# Patient Record
Sex: Male | Born: 1985 | Race: Black or African American | Hispanic: No | Marital: Single | Smoking: Never smoker
Health system: Southern US, Community
[De-identification: ages and names within clinical notes are randomized; demographics above are authoritative.]

---
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
## 2007-03-20 ENCOUNTER — Inpatient Hospital Stay: Payer: Self-pay | Admitting: Unknown Physician Specialty

## 2007-03-20 ENCOUNTER — Other Ambulatory Visit: Payer: Self-pay

## 2010-09-28 ENCOUNTER — Emergency Department: Payer: Self-pay | Admitting: Emergency Medicine

## 2012-06-13 IMAGING — CT CT HEAD WITHOUT CONTRAST
2 series · 16 of 30 positions shown, 20 images · non-contrast
Comparison: none

REASON FOR EXAM: FELL OFF PORCH
COMMENTS:

PROCEDURE:     CT  - CT HEAD WITHOUT CONTRAST  - September 28, 2010  [DATE]
RESULT:     Technique: Helical 5mm sections were obtained from the skull
base to the vertex without administration of intravenous contrast.

[Series 3: without · axial · non-contrast · 0.45mm/px · z∈[+390,+524]mm · 13 of 33 slices shown, 17 images]
[im 3/33  brain]
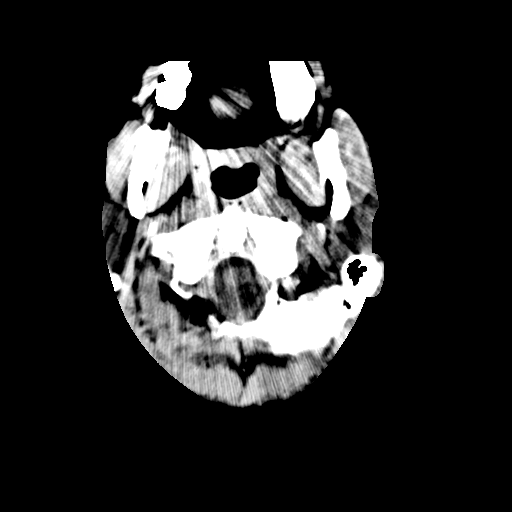
[im 3/33  bone]
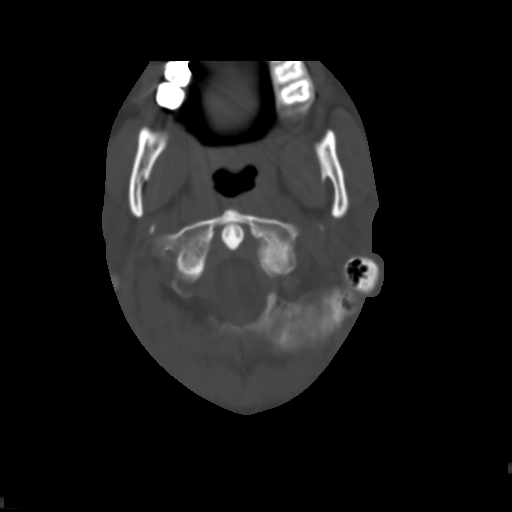
[im 5/33  brain]
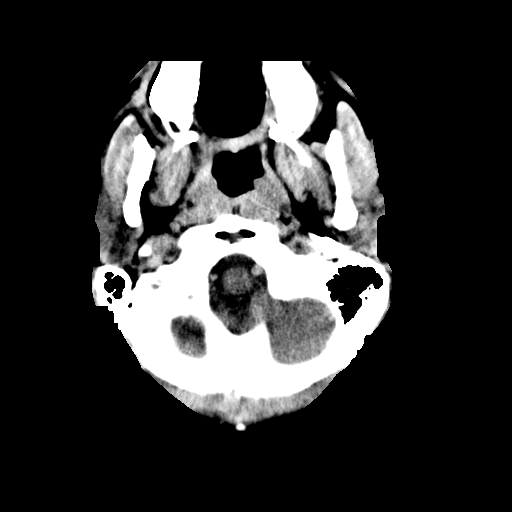
[im 7/33  brain]
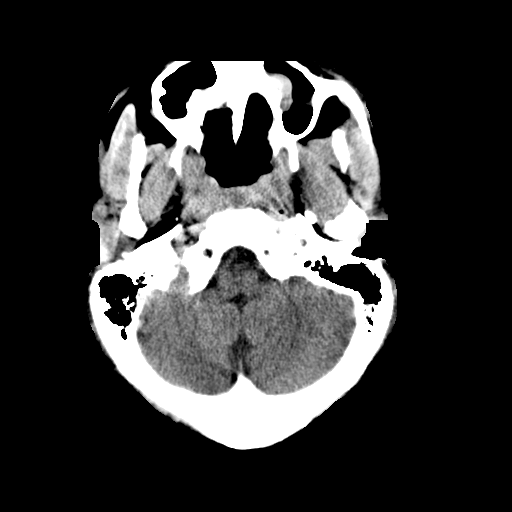
[im 10/33  brain]
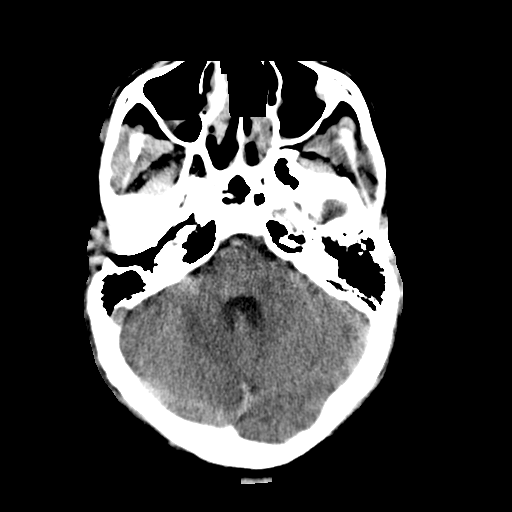
[im 12/33  brain]
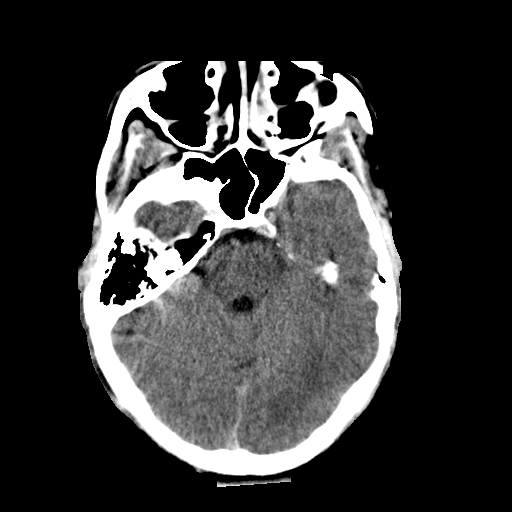
[im 12/33  bone]
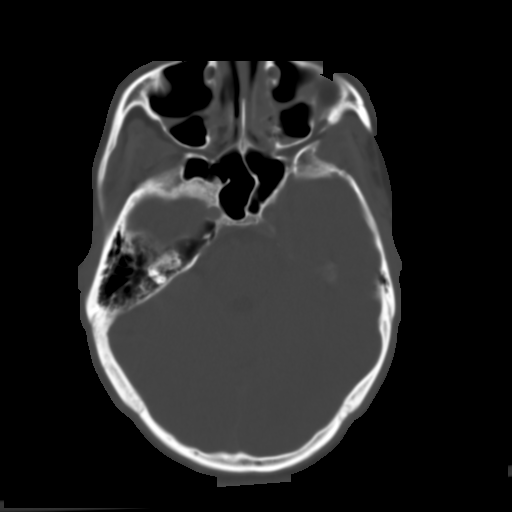
[im 14/33  brain]
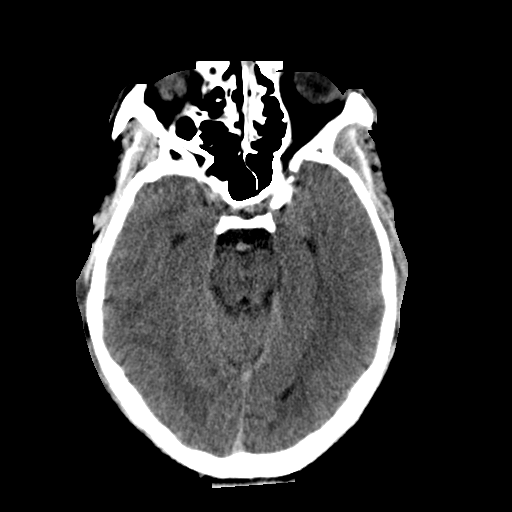
[im 17/33  brain]
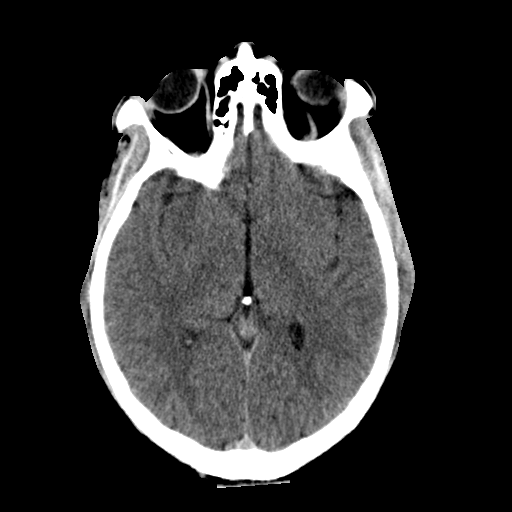
[im 19/33  brain]
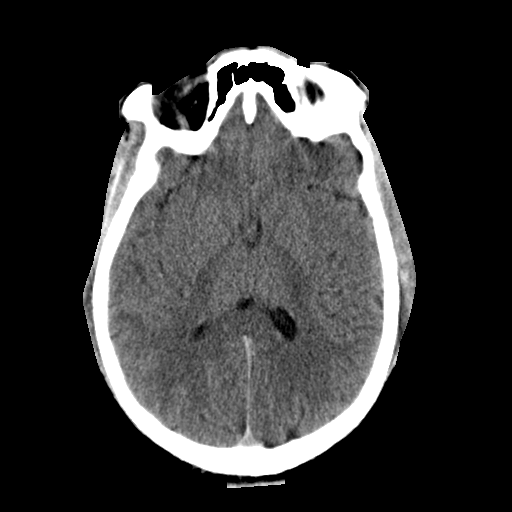
[im 21/33  brain]
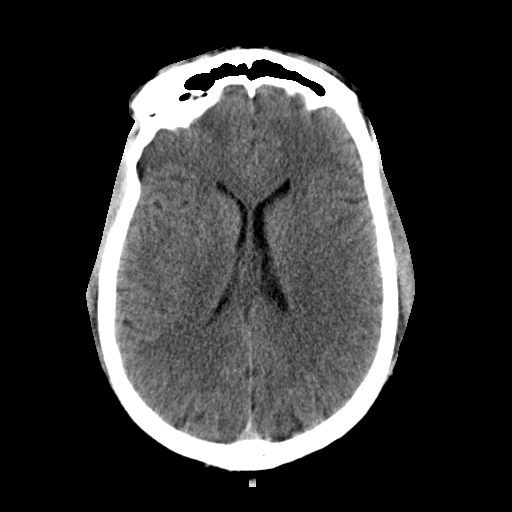
[im 21/33  bone]
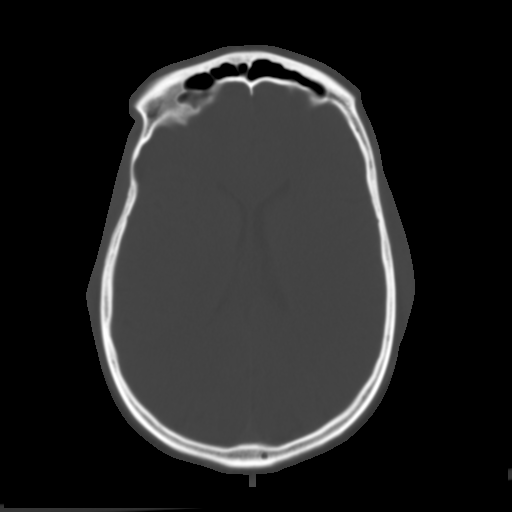
[im 23/33  brain]
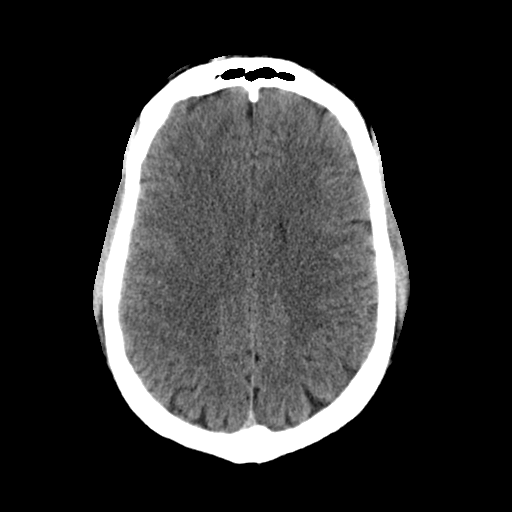
[im 26/33  brain]
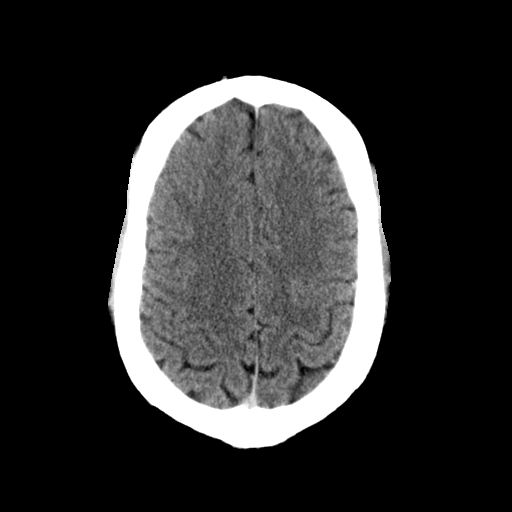
[im 28/33  brain]
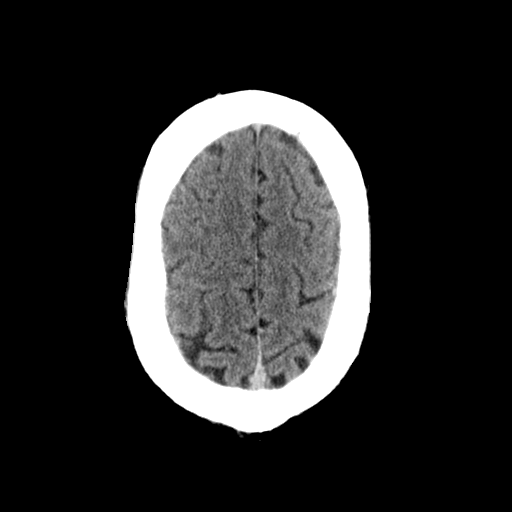
[im 30/33  brain]
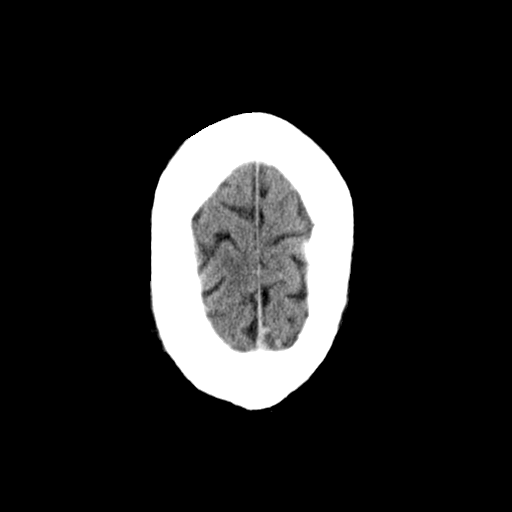
[im 30/33  bone]
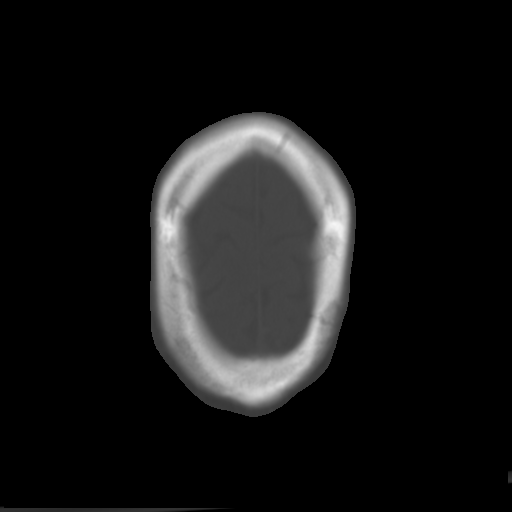

[Series 4: bone · axial · 0.45mm/px · z∈[+390,+434]mm · 3 of 33 slices shown]
[im 3/33  bone]
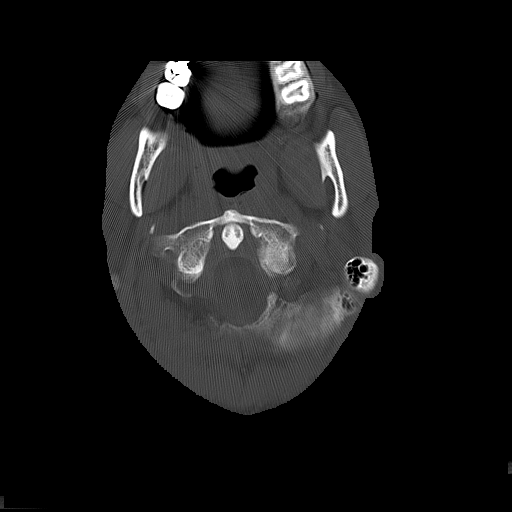
[im 7/33  bone]
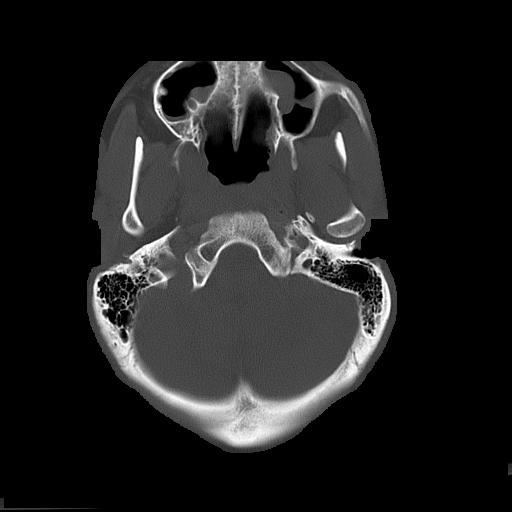
[im 12/33  bone]
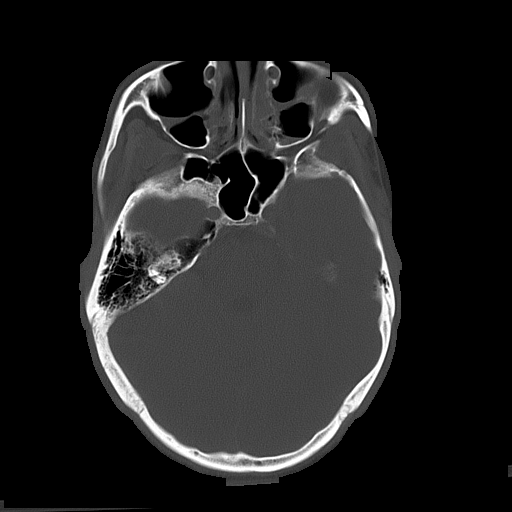

[16 of 30 positions shown; findings below may reference images not displayed]

FINDINGS: There is not evidence of intra-axial fluid collections. There is
no evidence of acute hemorrhage or secondary signs reflecting mass effect or
subacute or chronic focal territorial infarction. The osseous structures
demonstrate no evidence of a depressed skull fracture. If there is
persistent concern clinical follow-up with MRI is recommended.
IMPRESSION: 1. No evidence of acute intracranial abnormalitites.

## 2015-01-17 ENCOUNTER — Encounter: Payer: Self-pay | Admitting: Intensive Care

## 2015-01-17 ENCOUNTER — Emergency Department
Admission: EM | Admit: 2015-01-17 | Discharge: 2015-01-18 | Disposition: A | Discharge status: Home / Self Care | Payer: Self-pay | Attending: Emergency Medicine | Admitting: Emergency Medicine

## 2015-01-17 DIAGNOSIS — R45851 Suicidal ideations: Secondary | ICD-10-CM

## 2015-01-17 DIAGNOSIS — F329 Major depressive disorder, single episode, unspecified: Secondary | ICD-10-CM | POA: Insufficient documentation

## 2015-01-17 DIAGNOSIS — F32A Depression, unspecified: Secondary | ICD-10-CM

## 2015-01-17 LAB — URINE DRUG SCREEN, QUALITATIVE (ARMC ONLY)
AMPHETAMINES, UR SCREEN: NOT DETECTED
BENZODIAZEPINE, UR SCRN: NOT DETECTED
Barbiturates, Ur Screen: NOT DETECTED
CANNABINOID 50 NG, UR ~~LOC~~: NOT DETECTED
Cocaine Metabolite,Ur ~~LOC~~: NOT DETECTED
MDMA (ECSTASY) UR SCREEN: NOT DETECTED
METHADONE SCREEN, URINE: NOT DETECTED
Opiate, Ur Screen: NOT DETECTED
PHENCYCLIDINE (PCP) UR S: NOT DETECTED
TRICYCLIC, UR SCREEN: NOT DETECTED

## 2015-01-17 LAB — CBC
HEMATOCRIT: 50.9 % (ref 40.0–52.0)
Hemoglobin: 16.8 g/dL (ref 13.0–18.0)
MCH: 30.3 pg (ref 26.0–34.0)
MCHC: 33 g/dL (ref 32.0–36.0)
MCV: 91.6 fL (ref 80.0–100.0)
PLATELETS: 291 10*3/uL (ref 150–440)
RBC: 5.56 MIL/uL (ref 4.40–5.90)
RDW: 13.6 % (ref 11.5–14.5)
WBC: 3.9 10*3/uL (ref 3.8–10.6)

## 2015-01-17 LAB — COMPREHENSIVE METABOLIC PANEL
ALBUMIN: 4.7 g/dL (ref 3.5–5.0)
ALK PHOS: 48 U/L (ref 38–126)
ALT: 29 U/L (ref 17–63)
ANION GAP: 10 (ref 5–15)
AST: 23 U/L (ref 15–41)
BUN: 8 mg/dL (ref 6–20)
CALCIUM: 9.6 mg/dL (ref 8.9–10.3)
CHLORIDE: 104 mmol/L (ref 101–111)
CO2: 29 mmol/L (ref 22–32)
Creatinine, Ser: 1.15 mg/dL (ref 0.61–1.24)
GFR calc Af Amer: >60 mL/min (ref >60)
GLUCOSE: 110 mg/dL — AB (ref 65–99)
POTASSIUM: 3.7 mmol/L (ref 3.5–5.1)
SODIUM: 143 mmol/L (ref 135–145)
Total Bilirubin: 0.6 mg/dL (ref 0.3–1.2)
Total Protein: 8.7 g/dL — ABNORMAL HIGH (ref 6.5–8.1)

## 2015-01-17 LAB — ACETAMINOPHEN LEVEL: Acetaminophen (Tylenol), Serum: <10 ug/mL — ABNORMAL LOW (ref 10–30)

## 2015-01-17 LAB — SALICYLATE LEVEL

## 2015-01-17 LAB — ETHANOL: Alcohol, Ethyl (B): 227 mg/dL — ABNORMAL HIGH (ref <5)

## 2015-01-17 NOTE — ED Notes (Signed)
BEHAVIORAL HEALTH ROUNDING  Patient sleeping: No.  Patient alert and oriented: yes  Behavior appropriate: Yes. ; If no, describe:  Nutrition and fluids offered: Yes  Toileting and hygiene offered: Yes  Sitter present: not applicable  Law enforcement present: Yes ODS  

## 2015-01-17 NOTE — ED Notes (Signed)

## 2015-01-17 NOTE — ED Notes (Signed)

## 2015-01-17 NOTE — ED Notes (Signed)
BEHAVIORAL HEALTH ROUNDING Patient sleeping: Yes.   Patient alert and oriented: not applicable SLEEPING Behavior appropriate: Yes.  ; If no, describe: SLEEPING Nutrition and fluids offered: No SLEEPING Toileting and hygiene offered: NoSLEEPING Sitter present: not applicable Law enforcement present: Yes ODS 

## 2015-01-17 NOTE — ED Notes (Signed)
Patient assigned to appropriate care area. Patient oriented to unit/care area: Informed that, for their safety, care areas are designed for safety and monitored by staff at all times; and visiting hours explained to patient. Patient verbalizes understanding, and verbal contract for safety obtained. 

## 2015-01-17 NOTE — ED Notes (Signed)
BEHAVIORAL HEALTH ROUNDING Patient sleeping: No. Patient alert and oriented: yes Behavior appropriate: Yes.  ; If no, describe:  Nutrition and fluids offered: Yes  Toileting and hygiene offered: Yes  Sitter present: not applicable Law enforcement present: Yes  

## 2015-01-17 NOTE — ED Provider Notes (Signed)
[facility] Emergency Department Provider Note  Time seen: 6:42 PM  I have reviewed the triage vital signs and the nursing notes.   HISTORY  Chief Complaint Suicidal    HPI Kerry Gray is a 29 y.o. male with no past medical history who presents the emergency department with SI. According to the patient he states he was intoxicated drinking beer and made a comment to his mother that he was going to kill himself. Mom called police who found him to the emergency department under an involuntary commitment. Patient denies any SI or HI. He states it was an "empty threat "and he had nointention to follow through. The patient does admit however that in 2010 he tried to hurt himself taking an overdose of pills. Denies any medical complaints at this time.    History reviewed. No pertinent past medical history.  There are no active problems to display for this patient.   History reviewed. No pertinent past surgical history.  No current outpatient prescriptions on file.  Allergies Review of patient's allergies indicates no known allergies.  History reviewed. No pertinent family history.  Social History History  Substance Use Topics  . Smoking status: Never Smoker   . Smokeless tobacco: Never Used  . Alcohol Use: Yes     Comment: 24-40 oz/day    Review of Systems Constitutional: Negative for fever. Cardiovascular: Negative for chest pain. Respiratory: Negative for shortness of breath. Gastrointestinal: Negative for abdominal pain Genitourinary: Negative for dysuria. Musculoskeletal: Negative for back pain. Neurological: Negative for headache 10-point ROS otherwise negative.  ____________________________________________   PHYSICAL EXAM:  VITAL SIGNS: ED Triage Vitals  Enc Vitals Group     BP 01/17/15 1711 133/85 mmHg     Pulse Rate 01/17/15 1711 102     Resp 01/17/15 1711 20     Temp 01/17/15 1711 97.7 F (36.5 C)     Temp Source 01/17/15 1711  Oral     SpO2 01/17/15 1711 98 %     Weight 01/17/15 1711 160 lb (72.576 kg)     Height 01/17/15 1711  (1.88 m)     Head Cir --      Peak Flow --      Pain Score --      Pain Loc --      Pain Edu? --      Excl. in GC? --     Constitutional: Alert and oriented. Well appearing and in no distress. Eyes: Normal exam ENT   Head: Normocephalic and atraumatic.   Mouth/Throat: Mucous membranes are moist. Cardiovascular: Normal rate, regular rhythm. Respiratory: Normal respiratory effort without tachypnea nor retractions. Breath sounds are clear Gastrointestinal: Soft and nontender. No distention.  Musculoskeletal: Nontender with normal range of motion in all extremities.  Neurologic:  Normal speech and language. No gross focal neurologic deficits  Skin:  Skin is warm, dry and intact.  Psychiatric: Mood and affect are normal. Speech and behavior are normal.   ____________________________________________   INITIAL IMPRESSION / ASSESSMENT AND PLAN / ED COURSE  Pertinent labs & imaging results that were available during my care of the patient were reviewed by me and considered in my medical decision making (see chart for details).  Patient with SI under IVC who now denies SI. We will check labs. However due to his history of suicide attempt in the past and I will keep the patient under an involuntary commitment until he can be properly evaluated by psychiatry.  ____________________________________________  FINAL CLINICAL IMPRESSION(S) / ED DIAGNOSES  Depression Suicidal ideation   Minna AntisKevin Giulliana Mcroberts, MD 01/17/15 2322

## 2015-01-17 NOTE — ED Notes (Signed)
Report received from [employer] RN. Patient care assumed. Patient/RN introduction complete. Will continue to monitor.

## 2015-01-17 NOTE — ED Notes (Signed)
Pt. transfered to BHU without incident after report from. Placed in room and oriented to unit. Pt. informed that for their safety all care areas are designed for safety and monitored by security cameras at all times; and visiting hours explained to patient. Patient verbalizes understanding, and verbal contract for safety obtained.   

## 2015-01-17 NOTE — ED Notes (Signed)
APPEARANCE/BEHAVIOR  calm, cooperative and adequate rapport can be established  NEURO ASSESSMENT  Orientation: time, place and person  Hallucinations: No.None noted (Hallucinations)  Speech: Normal  Gait: normal  RESPIRATORY ASSESSMENT  WNL  CARDIOVASCULAR ASSESSMENT  WNL  GASTROINTESTINAL ASSESSMENT  WNL  EXTREMITIES  ROM of all joints is normal  PLAN OF CARE  Provide calm/safe environment. Vital signs assessed twice daily. ED BHU Assessment once each 12-hour shift. Collaborate with intake RN daily or as condition indicates. Assure the ED provider has rounded once each shift. Provide and encourage hygiene. Provide redirection as needed. Assess for escalating behavior; address immediately and inform ED provider.  Assess family dynamic and appropriateness for visitation as needed: Yes. ; If necessary, describe findings:  Educate the patient/family about BHU procedures/visitation: Yes. ; If necessary, describe findings:   

## 2015-01-17 NOTE — ED Notes (Signed)
ED BHU PLACEMENT JUSTIFICATION Is the patient under IVC or is there intent for IVC: Yes.   Is the patient medically cleared: Yes.   Is there vacancy in the ED BHU: Yes.   Is the population mix appropriate for patient: Yes.   Is the patient awaiting placement in inpatient or outpatient setting: Yes.   Has the patient had a psychiatric consult: No. Survey of unit performed for contraband, proper placement and condition of furniture, tampering with fixtures in bathroom, shower, and each patient room: Yes.  ; Findings:  APPEARANCE/BEHAVIOR calm, cooperative and adequate rapport can be established NEURO ASSESSMENT Orientation: time, place and person Hallucinations: No.None noted (Hallucinations) Speech: Normal Gait: normal RESPIRATORY ASSESSMENT Normal expansion.  Clear to auscultation.  No rales, rhonchi, or wheezing. CARDIOVASCULAR ASSESSMENT regular rate and rhythm, S1, S2 normal, no murmur, click, rub or gallop GASTROINTESTINAL ASSESSMENT soft, nontender, BS WNL, no r/g EXTREMITIES normal strength, tone, and muscle mass PLAN OF CARE Provide calm/safe environment. Vital signs assessed twice daily. ED BHU Assessment once each 12-hour shift. Collaborate with intake RN daily or as condition indicates. Assure the ED provider has rounded once each shift. Provide and encourage hygiene. Provide redirection as needed. Assess for escalating behavior; address immediately and inform ED provider.  Assess family dynamic and appropriateness for visitation as needed: Yes.  ; If necessary, describe findings:  Educate the patient/family about BHU procedures/visitation: Yes.  ; If necessary, describe findings:  

## 2015-01-17 NOTE — ED Notes (Signed)
[facility]. Pt brought in by [employer] under IVC. IVC papers reports pt stated he was going to kill himself with pipes in his house. RN assessed pt, pt denies SI/HI. Pt states "he does not want to hurt himself, they were empty threats". Pt cooperative

## 2015-01-17 NOTE — ED Notes (Signed)
Pt with no complaints at this time calm and cooperative.  Will continue to monitor.

## 2015-01-18 NOTE — ED Notes (Signed)
Pt changed out and dc'd to lobby; plan of care shared with mother

## 2015-01-18 NOTE — ED Notes (Signed)
No change in condition will continue to monitor  

## 2015-01-18 NOTE — Consult Note (Signed)
[facility] Consult   Reason for Consult:  Follow up Referring Physician:  ER Patient Identification: Kerry Gray MRN:  [id number] Principal Diagnosis: <principal problem not specified> Diagnosis:  There are no active problems to display for this patient.   Total Time spent with patient: 45 minutes  Subjective:   Kerry Gray is a 29 y.o. male patient admitted with "alcohol drinkimg.".  HPI:  Drinking at the rate of "s much as I can" Had a DWI. CC" Came here for help." HPI Elements:     Past Medical History: History reviewed. No pertinent past medical history. History reviewed. No pertinent past surgical history. Family History: History reviewed. No pertinent family history. Social History:  History  Alcohol Use  . Yes    Comment: 24-40 oz/day     History  Drug Use No    History   Social History  . Marital Status: Single    Spouse Name: N/A  . Number of Children: N/A  . Years of Education: N/A   Social History Main Topics  . Smoking status: Never Smoker   . Smokeless tobacco: Never Used  . Alcohol Use: Yes     Comment: 24-40 oz/day  . Drug Use: No  . Sexual Activity: Not on file   Other Topics Concern  . None   Social History Narrative  . None   Additional Social History:                          Allergies:  No Known Allergies  Labs:  Results for orders placed or performed during the hospital encounter of 01/17/15 (from the past 48 hour(s))  Acetaminophen level     Status: Abnormal   Collection Time: 01/17/15  5:20 PM  Result Value Ref Range   Acetaminophen (Tylenol), Serum <10 (L) 10 - 30 ug/mL    Comment:        THERAPEUTIC CONCENTRATIONS VARY SIGNIFICANTLY. A RANGE OF 10-30 ug/mL MAY BE AN EFFECTIVE CONCENTRATION FOR MANY PATIENTS. HOWEVER, SOME ARE BEST TREATED AT CONCENTRATIONS OUTSIDE THIS RANGE. ACETAMINOPHEN CONCENTRATIONS >150 ug/mL AT 4 HOURS AFTER INGESTION AND >50 ug/mL AT 12 HOURS AFTER INGESTION ARE OFTEN  ASSOCIATED WITH TOXIC REACTIONS.   CBC     Status: None   Collection Time: 01/17/15  5:20 PM  Result Value Ref Range   WBC 3.9 3.8 - 10.6 K/uL   RBC 5.56 4.40 - 5.90 MIL/uL   Hemoglobin 16.8 13.0 - 18.0 g/dL   HCT 50.9 40.0 - 52.0 %   MCV 91.6 80.0 - 100.0 fL   MCH 30.3 26.0 - 34.0 pg   MCHC 33.0 32.0 - 36.0 g/dL   RDW 13.6 11.5 - 14.5 %   Platelets 291 150 - 440 K/uL  Comprehensive metabolic panel     Status: Abnormal   Collection Time: 01/17/15  5:20 PM  Result Value Ref Range   Sodium 143 135 - 145 mmol/L   Potassium 3.7 3.5 - 5.1 mmol/L   Chloride 104 101 - 111 mmol/L   CO2 29 22 - 32 mmol/L   Glucose, Bld 110 (H) 65 - 99 mg/dL   BUN 8 6 - 20 mg/dL   Creatinine, Ser 1.15 0.61 - 1.24 mg/dL   Calcium 9.6 8.9 - 10.3 mg/dL   Total Protein 8.7 (H) 6.5 - 8.1 g/dL   Albumin 4.7 3.5 - 5.0 g/dL   AST 23 15 - 41 U/L   ALT 29 17 - 63  U/L   Alkaline Phosphatase 48 38 - 126 U/L   Total Bilirubin 0.6 0.3 - 1.2 mg/dL   GFR calc non Af Amer >60 >60 mL/min   GFR calc Af Amer >60 >60 mL/min    Comment: (NOTE) The eGFR has been calculated using the CKD EPI equation. This calculation has not been validated in all clinical situations. eGFR's persistently <60 mL/min signify possible Chronic Kidney Disease.    Anion gap 10 5 - 15  Ethanol (ETOH)     Status: Abnormal   Collection Time: 01/17/15  5:20 PM  Result Value Ref Range   Alcohol, Ethyl (B) 227 (H) <5 mg/dL    Comment:        LOWEST DETECTABLE LIMIT FOR SERUM ALCOHOL IS 11 mg/dL FOR MEDICAL PURPOSES ONLY   Salicylate level     Status: None   Collection Time: 01/17/15  5:20 PM  Result Value Ref Range   Salicylate Lvl <0.8 2.8 - 30.0 mg/dL  Urine Drug Screen, Qualitative [facility])     Status: None   Collection Time: 01/17/15  5:34 PM  Result Value Ref Range   Tricyclic, Ur Screen NONE DETECTED NONE DETECTED   Amphetamines, Ur Screen NONE DETECTED NONE DETECTED   MDMA (Ecstasy)Ur Screen NONE DETECTED NONE DETECTED   Cocaine  Metabolite,Ur [place] NONE DETECTED NONE DETECTED   Opiate, Ur Screen NONE DETECTED NONE DETECTED   Phencyclidine (PCP) Ur S NONE DETECTED NONE DETECTED   Cannabinoid 50 Ng, Ur [place] NONE DETECTED NONE DETECTED   Barbiturates, Ur Screen NONE DETECTED NONE DETECTED   Benzodiazepine, Ur Scrn NONE DETECTED NONE DETECTED   Methadone Scn, Ur NONE DETECTED NONE DETECTED    Comment: (NOTE) 657  Tricyclics, urine               Cutoff 1000 ng/mL 200  Amphetamines, urine             Cutoff 1000 ng/mL 300  MDMA (Ecstasy), urine           Cutoff 500 ng/mL 400  Cocaine Metabolite, urine       Cutoff 300 ng/mL 500  Opiate, urine                   Cutoff 300 ng/mL 600  Phencyclidine (PCP), urine      Cutoff 25 ng/mL 700  Cannabinoid, urine              Cutoff 50 ng/mL 800  Barbiturates, urine             Cutoff 200 ng/mL 900  Benzodiazepine, urine           Cutoff 200 ng/mL 1000 Methadone, urine                Cutoff 300 ng/mL 1100 1200 The urine drug screen provides only a preliminary, unconfirmed 1300 analytical test result and should not be used for non-medical 1400 purposes. Clinical consideration and professional judgment should 1500 be applied to any positive drug screen result due to possible 1600 interfering substances. A more specific alternate chemical method 1700 must be used in order to obtain a confirmed analytical result.  1800 Gas chromato graphy / mass spectrometry (GC/MS) is the preferred 1900 confirmatory method.     Vitals: Blood pressure 138/89, pulse 75, temperature 98.6 F (37 C), temperature source Oral, resp. rate 18, height _0  (1.88 m), weight 72.576 kg (160 lb), SpO2 100 %.  Risk to Self: Suicidal Ideation: Yes-Currently Present Suicidal  Intent: No ("It was just an empty threat.") Is patient at risk for suicide?: Yes Suicidal Plan?: No Access to Means: No What has been your use of drugs/alcohol within the last 12 months?: alcohol; "I had (2) 12 oz beers; BAC: 227 How  many times?: 0 Other Self Harm Risks: denies Triggers for Past Attempts: None known Intentional Self Injurious Behavior: None Risk to Others: Homicidal Ideation: No Thoughts of Harm to Others: No Current Homicidal Intent: No Current Homicidal Plan: No Access to Homicidal Means: No Identified Victim: none History of harm to others?: No Assessment of Violence: On admission Violent Behavior Description: none Does patient have access to weapons?: No Criminal Charges Pending?: Yes Describe Pending Criminal Charges: DUI Does patient have a court date: Yes Court Date: 02/12/15 Prior Inpatient Therapy: Prior Inpatient Therapy: Yes Prior Therapy Dates: '2008 Prior Therapy Facilty/Provider(s): [facility] Reason for Treatment: Depression Prior Outpatient Therapy: Prior Outpatient Therapy: No Does patient have an ACCT team?: No Does patient have Intensive In-House Services?  : No Does patient have [facility] services? : No Does patient have P4[facility] services?: No  No current facility-administered medications for this encounter.   No current outpatient prescriptions on file.    Musculoskeletal: Strength & Muscle Tone: within normal limits Gait & Station: normal Patient leans: N/A  Psychiatric Specialty Exam: Physical Exam  Review of Systems  Constitutional: Negative.   HENT: Negative.   Eyes: Negative.   Respiratory: Negative.   Cardiovascular: Negative.   Gastrointestinal: Negative.   Genitourinary: Negative.   Skin: Negative.   Neurological: Negative.   Endo/Heme/Allergies: Negative.   Psychiatric/Behavioral: Positive for substance abuse.    Blood pressure 138/89, pulse 75, temperature 98.6 F (37 C), temperature source Oral, resp. rate 18, height 6' 2" (1.88 m), weight 72.576 kg (160 lb), SpO2 100 %.Body mass index is 20.53 kg/(m^2).  General Appearance: Casual  Eye Contact::  Good  Speech:  Normal Rate  Volume:  Normal  Mood:  Anxious  Affect:  Appropriate  Thought Process:   Goal Directed  Orientation:  Full (Time, Place, and Person)  Thought Content:  WDL  Suicidal Thoughts:  No  Homicidal Thoughts:  No  Memory:  Immediate;   Good Recent;   Good Remote;   Good adequate  Judgement:  Intact  Insight:  Fair  Psychomotor Activity:  Normal  Concentration:  Good  Recall:  Good  Fund of Knowledge:Good  Language: Fair  Akathisia:  No  Handed:  Right  AIMS (if indicated):     Assets:  Communication Skills  ADL's:  Intact  Cognition: WNL  Sleep:      Medical Decision Making: Established Problem, Stable/Improving (1)  Treatment Plan Summary: Plan D/C IVC and discharge pt to appropriate program for substance abuse  Plan:  No evidence of imminent risk to self or others at present.   Disposition: as above  Dewain Penning 01/18/2015 12:53 PM

## 2015-01-18 NOTE — ED Notes (Signed)
BEHAVIORAL HEALTH ROUNDING Patient sleeping: No. Patient alert and oriented: yes Behavior appropriate: Yes.  ; If no, describe:  Nutrition and fluids offered: Yes  Toileting and hygiene offered: Yes  Sitter present: not applicable Law enforcement present: Yes  

## 2015-01-18 NOTE — ED Notes (Signed)

## 2015-01-18 NOTE — BH Assessment (Signed)
Assessment Note  Kerry Gray is an 29 y.o. male, presents to the ED via the police under IVC for c/o having suicidal thoughts with alcohol intoxication; BAC: 227. Per client, "I was at my mom's house; I was drinking; I had (2) 12 oz beers; I made an empty threat to harm myself around my mother; I was under the influence of alcohol; I'm ready to leave." T/C was placed to client's mother--Vanessa Jago--[phone] at 22:52, no answer.   Axis I: Alcohol Abuse and Major Depression, Recurrent severe Axis II: Deferred Axis III: History reviewed. No pertinent past medical history. Axis IV: other psychosocial or environmental problems and problems with primary support group Axis V: 11-20 some danger of hurting self or others possible OR occasionally fails to maintain minimal personal hygiene OR gross impairment in communication  Past Medical History: History reviewed. No pertinent past medical history.  History reviewed. No pertinent past surgical history.  Family History: History reviewed. No pertinent family history.  Social History:  reports that he has never smoked. He has never used smokeless tobacco. He reports that he drinks alcohol. He reports that he does not use illicit drugs.  Additional Social History:     CIWA: CIWA-Ar BP: 113/82 mmHg Pulse Rate: 86 COWS:    Allergies: No Known Allergies  Home Medications:  (Not in a hospital admission)  OB/GYN Status:  No LMP for male patient.  General Assessment Data Location of Assessment: [facility] ED TTS Assessment: In system Is this a Tele or Face-to-Face Assessment?: Face-to-Face Is this an Initial Assessment or a Re-assessment for this encounter?: Initial Assessment Marital status: Single Maiden name:  (none) Is patient pregnant?: No Pregnancy Status: No Living Arrangements: Parent Can pt return to current living arrangement?: Yes Admission Status: Involuntary Is patient capable of signing voluntary admission?: Yes Referral  Source: Other ([facility]) Insurance type: none  Medical Screening Exam ([facility] Walk-in ONLY) Medical Exam completed: Yes  Crisis Care Plan Living Arrangements: Parent Name of Psychiatrist: none Name of Therapist: none  Education Status Is patient currently in school?: No Current Grade: n/a Highest grade of school patient has completed: college--junior Name of school: n/a Contact person: mother: Erie Noe Wiler--[phone]  Risk to self with the past 6 months Suicidal Ideation: Yes-Currently Present Has patient been a risk to self within the past 6 months prior to admission? : No Suicidal Intent: No ("It was just an empty threat.") Has patient had any suicidal intent within the past 6 months prior to admission? : No Is patient at risk for suicide?: Yes Suicidal Plan?: No Has patient had any suicidal plan within the past 6 months prior to admission? : No Access to Means: No What has been your use of drugs/alcohol within the last 12 months?: alcohol; "I had (2) 12 oz beers; BAC: 227 Previous Attempts/Gestures: No How many times?: 0 Other Self Harm Risks: denies Triggers for Past Attempts: None known Intentional Self Injurious Behavior: None Family Suicide History: No Recent stressful life event(s): Conflict (Comment) Persecutory voices/beliefs?: No Depression: Yes Depression Symptoms: Feeling angry/irritable Substance abuse history and/or treatment for substance abuse?: Yes Suicide prevention information given to non-admitted patients: Yes  Risk to Others within the past 6 months Homicidal Ideation: No Does patient have any lifetime risk of violence toward others beyond the six months prior to admission? : No Thoughts of Harm to Others: No Current Homicidal Intent: No Current Homicidal Plan: No Access to Homicidal Means: No Identified Victim: none History of harm to others?: No Assessment of Violence:  On admission Violent Behavior Description: none Does patient have access to  weapons?: No Criminal Charges Pending?: Yes Describe Pending Criminal Charges: DUI Does patient have a court date: Yes Court Date: 02/12/15 Is patient on probation?: No  Psychosis Hallucinations: None noted Delusions: None noted  Mental Status Report Appearance/Hygiene: In scrubs, Unremarkable Eye Contact: Fair Motor Activity: Unremarkable Speech: Slow, Soft Level of Consciousness: Alert Mood: Sad Affect: Sad Anxiety Level: Minimal Thought Processes: Circumstantial Judgement: Partial Orientation: Person, Place Obsessive Compulsive Thoughts/Behaviors: None  Cognitive Functioning Concentration: Fair Memory: Recent Intact, Remote Intact IQ: Average Insight: Fair Impulse Control: Fair Appetite: Fair Weight Loss: 0 Weight Gain: 0 Sleep: No Change Total Hours of Sleep: 6 Vegetative Symptoms: None  ADLScreening [facility] Assessment Services) Patient's cognitive ability adequate to safely complete daily activities?: Yes Patient able to express need for assistance with ADLs?: Yes Independently performs ADLs?: Yes (appropriate for developmental age)  Prior Inpatient Therapy Prior Inpatient Therapy: Yes Prior Therapy Dates: '2008 Prior [facility]ilty[facility]Provider[facility] Reason for Treatment: Depression  Prior Outpatient Therapy Prior Outpatient Therapy: No Does patient have an ACCT team?: No Does patient have Intensive In-House Services?  : No Does patient have [facility] services? : No Does patient have P4CC services?: No  ADL Screening (condition at time of admission) Patient's cognitive ability adequate to safely complete daily activities?: Yes Patient able to express need for assistance with ADLs?: Yes Independently performs ADLs?: Yes (appropriate for developmental age)       Abuse/Neglect Assessment (Assessment to be complete while patient is alone) Physical Abuse: Denies Verbal Abuse: Denies Sexual Abuse: Denies Exploitation of patient/patient's resources:  Denies Self-Neglect: Denies Values / Beliefs Cultural Requests During Hospitalization: None Spiritual Requests During Hospitalization: None Consults Spiritual Care Consult Needed: No Social Work Consult Needed: No Merchant navy officerAdvance Directives (For Healthcare) Does patient have an advance directive?: No    Additional Information 1:1 In Past 12 Months?: No CIRT Risk: No Elopement Risk: No Does patient have medical clearance?: Yes  Child/Adolescent Assessment Running Away Risk: Denies Bed-Wetting: Denies Destruction of Property: Denies Cruelty to Animals: Denies Stealing: Denies Rebellious/Defies Authority: Denies Satanic Involvement: Denies Archivistire Setting: Denies Problems at [employer]: Denies Gang Involvement: Denies  Disposition:  Disposition Initial Assessment Completed for this Encounter: Yes Disposition of Patient: Referred to (Psych MD) Patient referred to: Other (Comment) (Psych MD to see)  On Site Evaluation by:   Reviewed with Physician:    Dwan BoltMargaret Stephaine Breshears 01/18/2015 3:32 AM

## 2015-01-18 NOTE — ED Notes (Signed)
Pt laying in bed.  

## 2015-01-18 NOTE — Discharge Instructions (Signed)
Please seek medical attention and help for any thoughts about wanting to harm herself, harm others, any concerning change in behavior, severe depression, inappropriate drug use or any other new or concerning symptoms.  Depression Depression refers to feeling sad, low, down in the dumps, blue, gloomy, or empty. In general, there are two kinds of depression: 1. Normal sadness or normal grief. This kind of depression is one that we all feel from time to time after upsetting life experiences, such as the loss of a job or the ending of a relationship. This kind of depression is considered normal, is short lived, and resolves within a few days to 2 weeks. Depression experienced after the loss of a loved one (bereavement) often lasts longer than 2 weeks but normally gets better with time. 2. Clinical depression. This kind of depression lasts longer than normal sadness or normal grief or interferes with your ability to function at home, at work, and in school. It also interferes with your personal relationships. It affects almost every aspect of your life. Clinical depression is an illness. Symptoms of depression can also be caused by conditions other than those mentioned above, such as:  Physical illness. Some physical illnesses, including underactive thyroid gland (hypothyroidism), severe anemia, specific types of cancer, diabetes, uncontrolled seizures, heart and lung problems, strokes, and chronic pain are commonly associated with symptoms of depression.  Side effects of some prescription medicine. In some people, certain types of medicine can cause symptoms of depression.  Substance abuse. Abuse of alcohol and illicit drugs can cause symptoms of depression. SYMPTOMS Symptoms of normal sadness and normal grief include the following:  Feeling sad or crying for short periods of time.  Not caring about anything (apathy).  Difficulty sleeping or sleeping too much.  No longer able to enjoy the things  you used to enjoy.  Desire to be by oneself all the time (social isolation).  Lack of energy or motivation.  Difficulty concentrating or remembering.  Change in appetite or weight.  Restlessness or agitation. Symptoms of clinical depression include the same symptoms of normal sadness or normal grief and also the following symptoms:  Feeling sad or crying all the time.  Feelings of guilt or worthlessness.  Feelings of hopelessness or helplessness.  Thoughts of suicide or the desire to harm yourself (suicidal ideation).  Loss of touch with reality (psychotic symptoms). Seeing or hearing things that are not real (hallucinations) or having false beliefs about your life or the people around you (delusions and paranoia). DIAGNOSIS  The diagnosis of clinical depression is usually based on how bad the symptoms are and how long they have lasted. Your health care provider will also ask you questions about your medical history and substance use to find out if physical illness, use of prescription medicine, or substance abuse is causing your depression. Your health care provider may also order blood tests. TREATMENT  Often, normal sadness and normal grief do not require treatment. However, sometimes antidepressant medicine is given for bereavement to ease the depressive symptoms until they resolve. The treatment for clinical depression depends on how bad the symptoms are but often includes antidepressant medicine, counseling with a mental health professional, or both. Your health care provider will help to determine what treatment is best for you. Depression caused by physical illness usually goes away with appropriate medical treatment of the illness. If prescription medicine is causing depression, talk with your health care provider about stopping the medicine, decreasing the dose, or changing to another medicine.  Depression caused by the abuse of alcohol or illicit drugs goes away when you stop  using these substances. Some adults need professional help in order to stop drinking or using drugs. SEEK IMMEDIATE MEDICAL CARE IF:  You have thoughts about hurting yourself or others.  You lose touch with reality (have psychotic symptoms).  You are taking medicine for depression and have a serious side effect. FOR MORE INFORMATION  National Alliance on Mental Illness: www.nami.[employer] of Mental Health: http://www.maynard.net/ Document Released: 07/30/2000 Document Revised: 12/17/2013 Document Reviewed: 11/01/2011 [employer] Patient Information 2015 [place], [place]. This information is not intended to replace advice given to you by your health care provider. Make sure you discuss any questions you have with your health care provider.

## 2015-01-18 NOTE — Progress Notes (Addendum)
Counselor spoke with patient's mother Erie Noe(Vanessa XBJYNiggs [phone]) who states that patient's mental health has been deteriorating for the last 6 weeks and has been under a lot of stress. Couple of weeks ago patient reported he was suicidal and felt like he couldn't do this anymore per mother. He also asked his mother to sign documents that she would take care of his child. Per mother, yesterday she came home and found he had been drinking. He said that he wanted to hang self off the rafters. Mother reports that she tried to talk to him but said that she was against him.  Mother identifies him to be a great dad. Last several months he lost his job and car. Mother states that he told her he just didn't feel like life was worth living. Mother reports patient has an alcohol problem and attempted suicide in 2008 attempted to commit suicide.   MD notified of collateral information by Clinical research associatewriter.

## 2015-01-18 NOTE — ED Notes (Signed)
RN with MD and 3 SW's on rounds. 

## 2015-01-18 NOTE — ED Notes (Signed)

## 2015-01-18 NOTE — ED Provider Notes (Signed)
-----------------------------------------   7:10 AM on 01/18/2015 -----------------------------------------   BP 113/82 mmHg  Pulse 86  Temp(Src) 98 F (36.7 C) (Oral)  Resp 18  Ht 6\' 2"  (1.88 m)  Wt 160 lb (72.576 kg)  BMI 20.53 kg/m2  SpO2 99%  The patient had no acute events since last update.  Calm and cooperative at this time.  Disposition is pending per Psychiatry/Behavioral Medicine team recommendations.     Sharman CheekPhillip Alaynna Kerwood, MD 01/18/15 [phone]

## 2015-01-18 NOTE — ED Provider Notes (Signed)
-----------------------------------------   4:00 PM on 01/18/2015 -----------------------------------------  Patient has been seen by psychiatry. They have rescinded the IVC. Patient will follow-up with [facility].  Phineas SemenGraydon Aili Casillas, MD 01/18/15 1600

## 2015-01-18 NOTE — [facility] Counselor (Signed)
LATE ENTRY------Per request of Psych MD, Dr. Guss Bundehalla, writer provided the pt. with information and instructions on how to access Out Pt. Mental Health Treatment ([facility])

## 2015-01-18 NOTE — ED Notes (Signed)
Pt resting in bed.

## 2015-01-18 NOTE — ED Notes (Signed)
BEHAVIORAL HEALTH ROUNDING Patient sleeping: No. Patient alert and oriented: yes Behavior appropriate: Yes.  ; If no, describe:  Nutrition and fluids offered: Yes  Toileting and hygiene offered: Yes  Sitter present: not applicable Law enforcement present: Yes ODS/shift 

## 2015-01-18 NOTE — ED Notes (Signed)
BEHAVIORAL HEALTH ROUNDING Patient sleeping: Yes.   Patient alert and oriented: not applicable Behavior appropriate: Yes.  ; If no, describe:  Nutrition and fluids offered: No Toileting and hygiene offered: Yes  Sitter present: not applicable Law enforcement present: Yes  

## 2015-03-03 ENCOUNTER — Encounter (HOSPITAL_COMMUNITY): Payer: Self-pay | Admitting: Emergency Medicine

## 2015-03-03 ENCOUNTER — Emergency Department (HOSPITAL_COMMUNITY)
Admission: EM | Admit: 2015-03-03 | Discharge: 2015-03-03 | Disposition: A | Discharge status: Home / Self Care | Payer: Self-pay | Attending: Emergency Medicine | Admitting: Emergency Medicine

## 2015-03-03 DIAGNOSIS — G510 Bell's palsy: Secondary | ICD-10-CM | POA: Insufficient documentation

## 2015-03-03 MED ORDER — ACYCLOVIR 200 MG PO CAPS
400.0000 mg | ORAL_CAPSULE | Freq: Once | ORAL | Status: AC
  Administered 2015-03-03: 400 mg via ORAL
  Filled 2015-03-03: qty 2

## 2015-03-03 MED ORDER — PREDNISONE 20 MG PO TABS
60.0000 mg | ORAL_TABLET | Freq: Once | ORAL | Status: AC
  Administered 2015-03-03: 60 mg via ORAL
  Filled 2015-03-03: qty 3

## 2015-03-03 MED ORDER — ACYCLOVIR 400 MG PO TABS: 400.0000 mg | ORAL_TABLET | Freq: Every day | ORAL | Status: DC

## 2015-03-03 MED ORDER — PREDNISONE 20 MG PO TABS: 60.0000 mg | ORAL_TABLET | Freq: Every day | ORAL | Status: DC

## 2015-03-03 NOTE — Discharge Instructions (Signed)
Please read and follow all provided instructions.  Your diagnoses today include:  1. Bell's palsy    Tests performed today include:  Vital signs. See below for your results today.   Medications prescribed:   Prednisone - steroid medicine   It is best to take this medication in the morning to prevent sleeping problems. If you are diabetic, monitor your blood sugar closely and stop taking Prednisone if blood sugar is over 300. Take with food to prevent stomach upset.    Acyclovir - medication for viral infection  Take any prescribed medications only as directed.  Home care instructions:  Follow any educational materials contained in this packet.  BE VERY CAREFUL not to take multiple medicines containing Tylenol (also called acetaminophen). Doing so can lead to an overdose which can damage your liver and cause liver failure and possibly death.   Follow-up instructions: Please follow-up with your primary care provider in the next 7 days for further evaluation of your symptoms.   Return instructions:   Please return to the Emergency Department if you experience worsening symptoms.  Patient counseled to return if they have weakness in their arms or legs, slurred speech, trouble walking or talking, confusion, trouble with their balance, or if they have any other concerns. Patient verbalizes understanding and agrees with plan.   Please return if you have any other emergent concerns.  Additional Information:  Your vital signs today were: BP 123/78 mmHg   Pulse 85   Temp(Src) 98 F (36.7 C)   Resp 16   Ht  (1.88 m)   Wt 160 lb (72.576 kg)   BMI 20.53 kg/m2   SpO2 100% If your blood pressure (BP) was elevated above 135/85 this visit, please have this repeated by your doctor within one month. --------------

## 2015-03-03 NOTE — ED Notes (Signed)
Pt reports right sided facial numbness x 2 days. Pt denies injury. Pt reports having a headache x 2 days. Pt with right sided facial weakness and weakness noted to right hand.

## 2015-03-03 NOTE — ED Provider Notes (Signed)
CSN: [id number]     Arrival date & time 03/03/15  0757 History   First MD Initiated Contact with Patient 03/03/15 0800     Chief Complaint  Patient presents with  . Numbness     (Consider location/radiation/quality/duration/timing/severity/associated sxs/prior Treatment) HPI Comments: Patient with EtOH use, has not had alcohol in 1 month -- presents with two-day history of left sided facial droop, numbness, dysgeusia. Reports unable to close R eye complete and has had to hold it closed himself. Also difficulty drinking fluids 2/2 lip weakness. Patient denies head injury. Patient denies any weakness, numbness, or tingling in his arms or legs. He has had mild headache. No fevers, neck pain or neck stiffness. No slurred speech. The onset of this condition was acute. The course is constant. Aggravating factors: none. Alleviating factors: none.   No h/o strokes, diabetes, high cholesterol, high blood pressure.   The history is provided by the patient.    History reviewed. No pertinent past medical history. History reviewed. No pertinent past surgical history. No family history on file. History  Substance Use Topics  . Smoking status: Never Smoker   . Smokeless tobacco: Never Used  . Alcohol Use: Yes     Comment: 24-40 oz/day    Review of Systems  Constitutional: Negative for fever.  HENT: Negative for congestion, dental problem, rhinorrhea and sinus pressure.   Eyes: Negative for photophobia, discharge, redness and visual disturbance.  Respiratory: Negative for shortness of breath.   Cardiovascular: Negative for chest pain.  Gastrointestinal: Negative for nausea and vomiting.  Musculoskeletal: Negative for gait problem, neck pain and neck stiffness.  Skin: Negative for rash.  Neurological: Positive for facial asymmetry, weakness (R facial), numbness (R face) and headaches. Negative for syncope, speech difficulty and light-headedness.  Psychiatric/Behavioral: Negative for confusion.       Allergies  Review of patient's allergies indicates no known allergies.  Home Medications   Prior to Admission medications   Not on File   BP 129/79 mmHg  Pulse 68  Temp(Src) 98 F (36.7 C)  Resp 16  Ht  (1.88 m)  Wt 160 lb (72.576 kg)  BMI 20.53 kg/m2  SpO2 100%   Physical Exam  Constitutional: He is oriented to person, place, and time. He appears well-developed and well-nourished.  HENT:  Head: Normocephalic and atraumatic.  Right Ear: Tympanic membrane, external ear and ear canal normal.  Left Ear: Tympanic membrane, external ear and ear canal normal.  Nose: Nose normal.  Mouth/Throat: Uvula is midline, oropharynx is clear and moist and mucous membranes are normal.  Eyes: Conjunctivae, EOM and lids are normal. Pupils are equal, round, and reactive to light.  Neck: Normal range of motion. Neck supple.  Cardiovascular: Normal rate and regular rhythm.   Pulmonary/Chest: Effort normal and breath sounds normal.  Abdominal: Soft. There is no tenderness.  Musculoskeletal: Normal range of motion.       Cervical back: He exhibits normal range of motion, no tenderness and no bony tenderness.  Neurological: He is alert and oriented to person, place, and time. He has normal strength and normal reflexes. A cranial nerve deficit is present. No sensory deficit. He exhibits normal muscle tone. He displays a negative Romberg sign. Coordination and gait normal. GCS eye subscore is 4. GCS verbal subscore is 5. GCS motor subscore is 6.  Patient ambulates normally. No weakness detected of flexors or extensors of upper or lower extremities.  Patient with right-sided cranial nerve deficit including weakness of right face.  No tongue weakness. Forehead is NOT spared.  Skin: Skin is warm and dry.  Psychiatric: He has a normal mood and affect.  Nursing note and vitals reviewed.   ED Course  Procedures (including critical care time) Labs Review Labs Reviewed - No data to  display  Imaging Review No results found.   EKG Interpretation None       8:30 AM Patient seen and examined. Work-up initiated. Medications ordered.   Vital signs reviewed and are as follows: BP 129/79 mmHg  Pulse 68  Temp(Src) 98 F (36.7 C)  Resp 16  Ht 6\' 2"  (1.88 m)  Wt 160 lb (72.576 kg)  BMI 20.53 kg/m2  SpO2 100%  Patient has clinical exam consistent with Bell's palsy. No Ramsay-Hunt syndrome noted.   I cannot detect any R upper extremity weakness as noted in nursing note and patient denies all right upper extremity symptoms.   MDM   Final diagnoses:  Bell's palsy   Patient with clinical exam consistent with Bell's palsy. Do not suspect central cause of his symptoms today. Patient does not have risk factors for CVA. He denies headache. Recently seen for alcohol use, however patient states that he has not had alcohol in over one month. No signs of head trauma to suspect fall.  PCP referral given. Tx started in ED. No h/o renal problems to preclude use of acyclovir.   No dangerous or life-threatening conditions suspected or identified by history, physical exam, and by work-up. No indications for hospitalization identified.      Renne CriglerJoshua Aleeyah Bensen, PA-C 03/03/15 [id number]  Mancel BaleElliott Wentz, MD 03/03/15 [phone]

## 2015-03-11 ENCOUNTER — Encounter (HOSPITAL_COMMUNITY): Payer: Self-pay | Admitting: Emergency Medicine

## 2015-03-11 ENCOUNTER — Emergency Department (INDEPENDENT_AMBULATORY_CARE_PROVIDER_SITE_OTHER)
Admission: EM | Admit: 2015-03-11 | Discharge: 2015-03-11 | Disposition: A | Discharge status: Home / Self Care | Payer: Self-pay | Source: Home / Self Care | Attending: Emergency Medicine | Admitting: Emergency Medicine

## 2015-03-11 DIAGNOSIS — G510 Bell's palsy: Secondary | ICD-10-CM

## 2015-03-11 NOTE — ED Notes (Signed)
Pt here for a f/u from his ED visit for Bell's Palsy.  Pt states he completed all medications prescribed and has seen no improvement in his symptoms, but has no further complaints.

## 2015-03-11 NOTE — ED Provider Notes (Signed)
CSN: [id number]     Arrival date & time 03/11/15  1301 History   First MD Initiated Contact with Patient 03/11/15 1317     Chief Complaint  Patient presents with  . bells palsy    (Consider location/radiation/quality/duration/timing/severity/associated sxs/prior Treatment) HPI  He is a 29 year old man here for recheck of Bell's palsy. He states his symptoms started on July 15 with right sided facial droop and inability to close his right eye. He was seen in the emergency room on the 18th and started on prednisone and acyclovir. He states he has completed these medications. His symptoms are the same. He is still unable to fully close his eye. He denies any trouble with redness or dryness of the right eye. No pain.  History reviewed. No pertinent past medical history. History reviewed. No pertinent past surgical history. History reviewed. No pertinent family history. History  Substance Use Topics  . Smoking status: Never Smoker   . Smokeless tobacco: Never Used  . Alcohol Use: Yes     Comment: 24-40 oz/day    Review of Systems As in history of present illness Allergies  Review of patient's allergies indicates no known allergies.  Home Medications   Prior to Admission medications   Medication Sig Start Date End Date Taking? Authorizing Provider  acyclovir (ZOVIRAX) 400 MG tablet Take 1 tablet (400 mg total) by mouth 5 (five) times daily. 03/03/15   Renne Crigler, PA-C  predniSONE (DELTASONE) 20 MG tablet Take 3 tablets (60 mg total) by mouth daily. 03/03/15   Renne Crigler, PA-C   BP 121/78 mmHg  Pulse 65  Temp(Src) 98.1 F (36.7 C) (Oral)  Resp 18  SpO2 99% Physical Exam  Constitutional: He is oriented to person, place, and time. He appears well-developed and well-nourished.  Eyes: Conjunctivae are normal. Pupils are equal, round, and reactive to light.  No redness of the right eye  Cardiovascular: Normal rate.   Pulmonary/Chest: Effort normal.  Neurological: He is alert and  oriented to person, place, and time.  Right facial droop that involves the forehead. Right eye does not fully close.    ED Course  Procedures (including critical care time) Labs Review Labs Reviewed - No data to display  Imaging Review No results found.   MDM   1. Bell's palsy    Reassurance provided. Discussed that this will take weeks to months to resolve. Handout given.    Charm Rings, MD 03/11/15 [phone]

## 2015-03-11 NOTE — Discharge Instructions (Signed)
Bell's Palsy °Bell's palsy is a condition in which the muscles on one side of the face cannot move (paralysis). This is because the nerves in the face are paralyzed. It is most often thought to be caused by a virus. The virus causes swelling of the nerve that controls movement on one side of the face. The nerve travels through a tight space surrounded by bone. When the nerve swells, it can be compressed by the bone. This results in damage to the protective covering around the nerve. This damage interferes with how the nerve communicates with the muscles of the face. As a result, it can cause weakness or paralysis of the facial muscles.  °Injury (trauma), tumor, and surgery may cause Bell's palsy, but most of the time the cause is unknown. It is a relatively common condition. It starts suddenly (abrupt onset) with the paralysis usually ending within 2 days. Bell's palsy is not dangerous. But because the eye does not close properly, you may need care to keep the eye from getting dry. This can include splinting (to keep the eye shut) or moistening with artificial tears. Bell's palsy very seldom occurs on both sides of the face at the same time. °SYMPTOMS  °· Eyebrow sagging. °· Drooping of the eyelid and corner of the mouth. °· Inability to close one eye. °· Loss of taste on the front of the tongue. °· Sensitivity to loud noises. °TREATMENT  °The treatment is usually non-surgical. If the patient is seen within the first 24 to 48 hours, a short course of steroids may be prescribed, in an attempt to shorten the length of the condition. Antiviral medicines may also be used with the steroids, but it is unclear if they are helpful.  °You will need to protect your eye, if you cannot close it. The cornea (clear covering over your eye) will become dry and can be damaged. Artificial tears can be used to keep your eye moist. Glasses or an eye patch should be worn to protect your eye. °PROGNOSIS  °Recovery is variable, ranging  from days to months. Although the problem usually goes away completely (about 80% of cases resolve), predicting the outcome is impossible. Most people improve within 3 weeks of when the symptoms began. Improvement may continue for 3 to 6 months. A small number of people have moderate to severe weakness that is permanent.  °HOME CARE INSTRUCTIONS  °· If your caregiver prescribed medication to reduce swelling in the nerve, use as directed. Do not stop taking the medication unless directed by your caregiver. °· Use moisturizing eye drops as needed to prevent drying of your eye, as directed by your caregiver. °· Protect your eye, as directed by your caregiver. °· Use facial massage and exercises, as directed by your caregiver. °· Perform your normal activities, and get your normal rest. °SEEK IMMEDIATE MEDICAL CARE IF:  °· There is pain, redness or irritation in the eye. °· You or your child has an oral temperature above 102° F (38.9° C), not controlled by medicine. °MAKE SURE YOU:  °· Understand these instructions. °· Will watch your condition. °· Will get help right away if you are not doing well or get worse. °Document Released: 08/02/2005 Document Revised: 10/25/2011 Document Reviewed: 11/09/2013 °ExitCare® Patient Information ©2015 [employer]. This information is not intended to replace advice given to you by your health care provider. Make sure you discuss any questions you have with your health care provider. ° °

## 2015-03-26 ENCOUNTER — Ambulatory Visit (INDEPENDENT_AMBULATORY_CARE_PROVIDER_SITE_OTHER): Payer: Self-pay | Admitting: Family Medicine

## 2015-03-26 VITALS — BP 117/72 | HR 79 | Temp 98.2°F | Resp 14 | Ht 74.0 in | Wt 170.0 lb

## 2015-03-26 DIAGNOSIS — Z7689 Persons encountering health services in other specified circumstances: Secondary | ICD-10-CM

## 2015-03-26 DIAGNOSIS — Z7189 Other specified counseling: Secondary | ICD-10-CM

## 2015-03-26 DIAGNOSIS — G510 Bell's palsy: Secondary | ICD-10-CM

## 2015-03-26 DIAGNOSIS — Z0000 Encounter for general adult medical examination without abnormal findings: Secondary | ICD-10-CM

## 2015-03-26 NOTE — Progress Notes (Signed)
Patient ID: Kerry Gray, male   DOB: Dec 23, 1985, 29 y.o.   MRN: [id number]   Kerry Gray, is a 29 y.o. male  WUJ:[id number]  [id number]  DOB - 07/03/1986  CC:  Chief Complaint  Patient presents with  . Establish Care       HPI: Kerry Gray is a 29 y.o. male here to establish care. He reports no chronic illnesses. He does have a recent history of Bells's Palsy and was treated with prednisone and acyclovir symptoms are resolving.He is on no chronic medications. No Known Allergies No past medical history on file. Current Outpatient Prescriptions on File Prior to Visit  Medication Sig Dispense Refill  . acyclovir (ZOVIRAX) 400 MG tablet Take 1 tablet (400 mg total) by mouth 5 (five) times daily. (Patient not taking: Reported on 03/26/2015) 35 tablet 0  . predniSONE (DELTASONE) 20 MG tablet Take 3 tablets (60 mg total) by mouth daily. (Patient not taking: Reported on 03/26/2015) 21 tablet 0   No current facility-administered medications on file prior to visit.   No family history on file. Social History   Social History  . Marital Status: Single    Spouse Name: N/A  . Number of Children: N/A  . Years of Education: N/A   Occupational History  . Not on file.   Social History Main Topics  . Smoking status: Never Smoker   . Smokeless tobacco: Never Used  . Alcohol Use: Yes     Comment: 24-40 oz/day  . Drug Use: No  . Sexual Activity: Not on file   Other Topics Concern  . Not on file   Social History Narrative    Review of Systems: Constitutional: Negative for fever, chills, appetite change, weight loss,  fatigue. HENT: Negative for ear pain, ear discharge.nose bleeds Eyes: Negative for pain, discharge, redness, itching and visual disturbance. Neck: Negative for pain, stiffness Respiratory: Negative for cough, shortness of breath,   Cardiovascular: Negative for chest pain, palpitations and leg swelling. Gastrointestinal: Negative for abdominal distention, abdominal  pain, nausea, vomiting, diarrhea, constipations Genitourinary: Negative for dysuria, urgency, frequency, hematuria, flank pain,  Musculoskeletal: Negative for back pain, joint pain, joint  swelling, arthralgia and gait problem.Negative for weakness. Neurological: Negative for dizziness, tremors, seizures, syncope,   light-headedness, numbness and headaches.  Hematological: Negative for easy bruising or bleeding Psychiatric/Behavioral: Negative for depression, anxiety, decreased concentration, confusion    Objective:   Filed Vitals:   03/26/15 1347  BP: 117/72  Pulse: 79  Temp: 98.2 F (36.8 C)  Resp: 14    Physical Exam: Constitutional: Patient appears well-developed and well-nourished. No distress. HENT: Normocephalic, atraumatic, External right and left ear normal. Oropharynx is clear and moist.  Eyes: Conjunctivae and EOM are normal. PERRLA, no scleral icterus. Neck: Normal ROM. Neck supple. No lymphadenopathy, No thyromegaly. CVS: RRR, S1/S2 +, no murmurs, no gallops, no rubs Pulmonary: Effort and breath sounds normal, no stridor, rhonchi, wheezes, rales.  Abdominal: Soft. Normoactive BS,, no distension, tenderness, rebound or guarding.  Musculoskeletal: Normal range of motion. No edema and no tenderness.  Neuro: Alert.Normal muscle tone coordination. Non-focal Skin: Skin is warm and dry. No rash noted. Not diaphoretic. No erythema. No pallor. Psychiatric: Normal mood and affect. Behavior, judgment, thought content normal.  Lab Results  Component Value Date   WBC 3.9 01/17/2015   HGB 16.8 01/17/2015   HCT 50.9 01/17/2015   MCV 91.6 01/17/2015   PLT 291 01/17/2015   Lab Results  Component Value Date   CREATININE 1.15 01/17/2015  BUN 8 01/17/2015   NA 143 01/17/2015   K 3.7 01/17/2015   CL 104 01/17/2015   CO2 29 01/17/2015    No results found for: HGBA1C Lipid Panel  No results found for: CHOL, TRIG, HDL, CHOLHDL, VLDL, LDLCALC     Assessment and plan:    Visit to establish care -He has recently bloodwork and none of thay calls for a recheck today -HIV as he has not had a screening  No Follow-up on file.  The patient was given clear instructions to go to ER or return to medical center if symptoms don't improve, worsen or new problems develop. The patient verbalized understanding. The patient was told to call to get lab results if they haven't heard anything in the next week.       Kerry Hoover, MSN, FNP-BC   03/26/2015, 1:52 PM

## 2015-03-26 NOTE — Patient Instructions (Signed)
Work on trying to live a healthy life style, including diet of lots of fruits and vegetables, low in fats and cholesterol and concentrated sweets. Drink plenty of water Use safety measures such as seat belts, helmets, make sure your fire extinguishers and smoke alarms in good contion Use protection to prevent sexually transmitted diseases Follow-up in one year for routine health care and as needed otherwise.

## 2015-03-27 ENCOUNTER — Encounter: Payer: Self-pay | Admitting: Family Medicine

## 2015-03-27 DIAGNOSIS — G510 Bell's palsy: Secondary | ICD-10-CM | POA: Insufficient documentation

## 2015-03-27 LAB — HIV ANTIBODY (ROUTINE TESTING W REFLEX): HIV 1&2 Ab, 4th Generation: NONREACTIVE

## 2015-03-27 LAB — RPR

## 2018-10-25 ENCOUNTER — Other Ambulatory Visit: Payer: Self-pay

## 2018-10-25 ENCOUNTER — Emergency Department: Payer: Self-pay

## 2018-10-25 ENCOUNTER — Encounter: Payer: Self-pay | Admitting: Emergency Medicine

## 2018-10-25 ENCOUNTER — Emergency Department
Admission: EM | Admit: 2018-10-25 | Discharge: 2018-10-25 | Disposition: A | Discharge status: Home / Self Care | Payer: Self-pay | Attending: Emergency Medicine | Admitting: Emergency Medicine

## 2018-10-25 DIAGNOSIS — R0789 Other chest pain: Secondary | ICD-10-CM

## 2018-10-25 DIAGNOSIS — R079 Chest pain, unspecified: Secondary | ICD-10-CM | POA: Insufficient documentation

## 2018-10-25 DIAGNOSIS — R000 Tachycardia, unspecified: Secondary | ICD-10-CM | POA: Insufficient documentation

## 2018-10-25 LAB — BASIC METABOLIC PANEL
Anion gap: 14 (ref 5–15)
BUN: 8 mg/dL (ref 6–20)
CO2: 26 mmol/L (ref 22–32)
CREATININE: 0.99 mg/dL (ref 0.61–1.24)
Calcium: 9.4 mg/dL (ref 8.9–10.3)
Chloride: 100 mmol/L (ref 98–111)
GFR calc Af Amer: >60 mL/min (ref >60)
GLUCOSE: 134 mg/dL — AB (ref 70–99)
Potassium: 3.5 mmol/L (ref 3.5–5.1)
SODIUM: 140 mmol/L (ref 135–145)

## 2018-10-25 LAB — CBC
HCT: 48.4 % (ref 39.0–52.0)
Hemoglobin: 16.8 g/dL (ref 13.0–17.0)
MCH: 29.5 pg (ref 26.0–34.0)
MCHC: 34.7 g/dL (ref 30.0–36.0)
MCV: 84.9 fL (ref 80.0–100.0)
NRBC: 0 % (ref 0.0–0.2)
PLATELETS: 342 10*3/uL (ref 150–400)
RBC: 5.7 MIL/uL (ref 4.22–5.81)
RDW: 13.3 % (ref 11.5–15.5)
WBC: 4.6 10*3/uL (ref 4.0–10.5)

## 2018-10-25 LAB — TROPONIN I

## 2018-10-25 LAB — TSH: TSH: 3.655 u[IU]/mL (ref 0.350–4.500)

## 2018-10-25 MED ORDER — SODIUM CHLORIDE 0.9% FLUSH: 3.0000 mL | Freq: Once | INTRAVENOUS | Status: DC

## 2018-10-25 MED ORDER — LORAZEPAM 0.5 MG PO TABS: 0.5000 mg | ORAL_TABLET | Freq: Two times a day (BID) | ORAL | 0 refills | Status: AC | PRN

## 2018-10-25 MED ORDER — SODIUM CHLORIDE 0.9 % IV SOLN
1000.0000 mL | Freq: Once | INTRAVENOUS | Status: AC
  Administered 2018-10-25: 1000 mL via INTRAVENOUS

## 2018-10-25 NOTE — ED Notes (Signed)
Pt denies CP at this time; per pt "I don't feel pain, it feels more like a discomfort on my chest. Pt c/o dizziness. N/V and SHOB that started last night.

## 2018-10-25 NOTE — ED Provider Notes (Signed)
[facility] Emergency Department Provider Note   ____________________________________________    I have reviewed the triage vital signs and the nursing notes.   HISTORY  Chief Complaint Chest Pain     HPI Kerry Gray is a 33 y.o. male who presents with complaints of palpitations.  Patient reports that over the last 2 to 3 days he feels like his heart has been beating more rapidly than typical, he reports discomfort in the left side of his chest.  No pleurisy or shortness of breath.  No travel.  No calf pain or swelling.  No history of heart disease.  No fevers or chills or nausea or vomiting.  Has not take anything for this.  History reviewed. No pertinent past medical history.  Patient Active Problem List   Diagnosis Date Noted  . Bell's palsy 03/27/2015    History reviewed. No pertinent surgical history.  Prior to Admission medications   Medication Sig Start Date End Date Taking? Authorizing Provider  LORazepam (ATIVAN) 0.5 MG tablet Take 1 tablet (0.5 mg total) by mouth 2 (two) times daily as needed for anxiety. 10/25/18 10/25/19  Jene Every, MD     Allergies Patient has no known allergies.  No family history on file.  Social History Social History   Tobacco Use  . Smoking status: Never Smoker  . Smokeless tobacco: Never Used  Substance Use Topics  . Alcohol use: Yes    Comment: 24-40 oz/day  . Drug use: No    Review of Systems  Constitutional: No fever/chills Eyes: No visual changes.  ENT: No sore throat. Cardiovascular: As above Respiratory: As above Gastrointestinal: No abdominal pain.  No nausea, no vomiting.   Genitourinary: Negative for dysuria. Musculoskeletal: Negative for back pain. Skin: Negative for rash. Neurological: Negative for headaches or weakness   ____________________________________________   PHYSICAL EXAM:  VITAL SIGNS: ED Triage Vitals  Enc Vitals Group     BP 10/25/18 1026 (!) 144/105   Pulse Rate 10/25/18 1026 (!) 113     Resp 10/25/18 1026 16     Temp 10/25/18 1026 98.2 F (36.8 C)     Temp Source 10/25/18 1026 Oral     SpO2 10/25/18 1026 97 %     Weight 10/25/18 1024 81.6 kg (180 lb)     Height 10/25/18 1024 1.88 m (6\' 2" )     Head Circumference --      Peak Flow --      Pain Score 10/25/18 1024 6     Pain Loc --      Pain Edu? --      Excl. in GC? --     Constitutional: Alert and oriented.  Eyes: Conjunctivae are normal.   Nose: No congestion/rhinnorhea. Mouth/Throat: Mucous membranes are moist.    Cardiovascular: Tachycardia, regular rhythm. Grossly normal heart sounds.  Good peripheral circulation. Respiratory: Normal respiratory effort.  No retractions. Lungs CTAB. Gastrointestinal: Soft and nontender. No distention.  Genitourinary: deferred Musculoskeletal: No lower extremity tenderness nor edema.  Warm and well perfused Neurologic:  Normal speech and language. No gross focal neurologic deficits are appreciated.  Skin:  Skin is warm, dry and intact. No rash noted. Psychiatric: Mood and affect are normal. Speech and behavior are normal.  ____________________________________________   LABS (all labs ordered are listed, but only abnormal results are displayed)  Labs Reviewed  BASIC METABOLIC PANEL - Abnormal; Notable for the following components:      Result Value   Glucose, Bld 134 (*)  All other components within normal limits  CBC  TROPONIN I  TSH   ____________________________________________  EKG  ED ECG REPORT I, Jene Every, the attending physician, personally viewed and interpreted this ECG.  Date: 10/25/2018   Rhythm: Sinus tachycardia QRS Axis: normal Intervals: normal ST/T Wave abnormalities: normal Narrative Interpretation: no evidence of acute ischemia  ____________________________________________  RADIOLOGY  X-ray unremarkable ____________________________________________   PROCEDURES  Procedure(s)  performed: No  Procedures   Critical Care performed: No ____________________________________________   INITIAL IMPRESSION / ASSESSMENT AND PLAN / ED COURSE  Pertinent labs & imaging results that were available during my care of the patient were reviewed by me and considered in my medical decision making (see chart for details).  Patient overall well-appearing and in no acute distress.  Mild tachycardia, differential includes dehydration, thyroid disease, anxiety, PERC negative for PE.  Lab work is all quite reassuring, troponin normal.  Chest x-ray is benign.  Patient's heart rate improved after fluids.  TSH is normal  Discussed with patient's mother who arrived and states she believes he is suffering from anxiety, upon prompting the patient does admit to recently losing his job and he thinks this is related.  Given this we will trial several days of as needed low-dose Ativan to see if this helps him sleep as he admits he has not been sleeping.  If no improvement he will need cardiology follow-up.  He agrees with this plan.  Return precautions discussed    ____________________________________________   FINAL CLINICAL IMPRESSION(S) / ED DIAGNOSES  Final diagnoses:  Atypical chest pain  Tachycardia        Note:  This document was prepared using Dragon voice recognition software and may include unintentional dictation errors.   Jene Every, MD 10/25/18 [phone]

## 2018-10-25 NOTE — ED Notes (Signed)
First Nurse Note: Patient complaining of chest pain with vomiting X 2 days.  Alert and oriented, NAD.

## 2018-10-25 NOTE — ED Notes (Signed)
NAD noted at this time. Pt placed on Cardiac monitor. This RN will continue to monitor pt.

## 2018-10-25 NOTE — ED Triage Notes (Signed)
Pt to ED via POV c/o chest pain x 2 days and nausea since last night. Pt states that the pain is in the left side of his chest. Pt denies radiation of pain. Pt is in NAD at this time.

## 2018-12-20 ENCOUNTER — Other Ambulatory Visit: Payer: Self-pay

## 2018-12-20 ENCOUNTER — Emergency Department
Admission: EM | Admit: 2018-12-20 | Discharge: 2018-12-20 | Disposition: A | Discharge status: Home / Self Care | Payer: Self-pay | Attending: Emergency Medicine | Admitting: Emergency Medicine

## 2018-12-20 DIAGNOSIS — E860 Dehydration: Secondary | ICD-10-CM | POA: Insufficient documentation

## 2018-12-20 DIAGNOSIS — K529 Noninfective gastroenteritis and colitis, unspecified: Secondary | ICD-10-CM | POA: Insufficient documentation

## 2018-12-20 LAB — COMPREHENSIVE METABOLIC PANEL
ALT: 48 U/L — ABNORMAL HIGH (ref 0–44)
AST: 35 U/L (ref 15–41)
Albumin: 4.6 g/dL (ref 3.5–5.0)
Alkaline Phosphatase: 73 U/L (ref 38–126)
Anion gap: 14 (ref 5–15)
BUN: 16 mg/dL (ref 6–20)
CO2: 23 mmol/L (ref 22–32)
Calcium: 9.5 mg/dL (ref 8.9–10.3)
Chloride: 101 mmol/L (ref 98–111)
Creatinine, Ser: 1.22 mg/dL (ref 0.61–1.24)
GFR calc Af Amer: >60 mL/min (ref >60)
GFR calc non Af Amer: >60 mL/min (ref >60)
Glucose, Bld: 115 mg/dL — ABNORMAL HIGH (ref 70–99)
Potassium: 3.2 mmol/L — ABNORMAL LOW (ref 3.5–5.1)
Sodium: 138 mmol/L (ref 135–145)
Total Bilirubin: 2.4 mg/dL — ABNORMAL HIGH (ref 0.3–1.2)
Total Protein: 9.1 g/dL — ABNORMAL HIGH (ref 6.5–8.1)

## 2018-12-20 LAB — CBC WITH DIFFERENTIAL/PLATELET
Abs Immature Granulocytes: 0.02 10*3/uL (ref 0.00–0.07)
Basophils Absolute: 0 10*3/uL (ref 0.0–0.1)
Basophils Relative: 1 %
Eosinophils Absolute: 0.1 10*3/uL (ref 0.0–0.5)
Eosinophils Relative: 1 %
HCT: 51.5 % (ref 39.0–52.0)
Hemoglobin: 17.7 g/dL — ABNORMAL HIGH (ref 13.0–17.0)
Immature Granulocytes: 0 %
Lymphocytes Relative: 41 %
Lymphs Abs: 2.5 10*3/uL (ref 0.7–4.0)
MCH: 30.4 pg (ref 26.0–34.0)
MCHC: 34.4 g/dL (ref 30.0–36.0)
MCV: 88.3 fL (ref 80.0–100.0)
Monocytes Absolute: 0.5 10*3/uL (ref 0.1–1.0)
Monocytes Relative: 8 %
Neutro Abs: 3 10*3/uL (ref 1.7–7.7)
Neutrophils Relative %: 49 %
Platelets: 349 10*3/uL (ref 150–400)
RBC: 5.83 MIL/uL — ABNORMAL HIGH (ref 4.22–5.81)
RDW: 13 % (ref 11.5–15.5)
WBC: 6.1 10*3/uL (ref 4.0–10.5)
nRBC: 0 % (ref 0.0–0.2)

## 2018-12-20 LAB — LIPASE, BLOOD: Lipase: 21 U/L (ref 11–51)

## 2018-12-20 MED ORDER — SODIUM CHLORIDE 0.9 % IV SOLN
Freq: Once | INTRAVENOUS | Status: AC
  Administered 2018-12-20: 11:00:00 via INTRAVENOUS

## 2018-12-20 MED ORDER — ONDANSETRON 4 MG PO TBDP: 4.0000 mg | ORAL_TABLET | Freq: Three times a day (TID) | ORAL | 0 refills | Status: AC | PRN

## 2018-12-20 MED ORDER — SODIUM CHLORIDE 0.9 % IV SOLN
Freq: Once | INTRAVENOUS | Status: AC
  Administered 2018-12-20: 12:00:00 via INTRAVENOUS

## 2018-12-20 MED ORDER — ONDANSETRON HCL 4 MG/2ML IJ SOLN
4.0000 mg | Freq: Once | INTRAMUSCULAR | Status: AC
  Administered 2018-12-20: 4 mg via INTRAVENOUS
  Filled 2018-12-20: qty 2

## 2018-12-20 NOTE — ED Triage Notes (Signed)
Pt here via EMS for nausea, vomiting and diarrhea x 3 days. Pt reports vomiting started on Sunday, last episode of emesis was last night after trying to drink some water. Reports when laying down heart will sometimes race. No pain, no signs of acute distress.

## 2018-12-20 NOTE — ED Provider Notes (Signed)
[facility] Emergency Department Provider Note       Time seen: ----------------------------------------- 10:18 AM on 12/20/2018 -----------------------------------------   I have reviewed the triage vital signs and the nursing notes.  HISTORY   Chief Complaint Emesis    HPI Kerry Gray is a 33 y.o. male with a history of Bell's palsy who presents to the ED for nausea, vomiting and diarrhea for the past 3 days.  Patient reports vomiting started on Sunday, last episode of vomiting was last night.  He cannot keep anything down.  Patient reports sometimes when he lays down his heart will race, and that he feels dehydrated.  He denies any respiratory symptoms.  No past medical history on file.  Patient Active Problem List   Diagnosis Date Noted  . Bell's palsy 03/27/2015    No past surgical history on file.  Allergies Patient has no known allergies.  Social History Social History   Tobacco Use  . Smoking status: Never Smoker  . Smokeless tobacco: Never Used  Substance Use Topics  . Alcohol use: Yes    Comment: 24-40 oz/day  . Drug use: No   Review of Systems Constitutional: Negative for fever. Cardiovascular: Negative for chest pain. Respiratory: Negative for shortness of breath. Gastrointestinal: Negative for abdominal pain, positive for vomiting and diarrhea Musculoskeletal: Negative for back pain. Skin: Negative for rash. Neurological: Negative for headaches, focal weakness or numbness.  All systems negative/normal/unremarkable except as stated in the HPI  ____________________________________________   PHYSICAL EXAM:  VITAL SIGNS: ED Triage Vitals  Enc Vitals Group     BP      Pulse      Resp      Temp      Temp src      SpO2      Weight      Height      Head Circumference      Peak Flow      Pain Score      Pain Loc      Pain Edu?      Excl. in GC?    Constitutional: Alert and oriented. Well appearing and in no  distress. Eyes: Conjunctivae are normal. Normal extraocular movements. ENT      Head: Normocephalic and atraumatic.      Nose: No congestion/rhinnorhea.      Mouth/Throat: Mucous membranes are moist.      Neck: No stridor. Cardiovascular: Normal rate, regular rhythm. No murmurs, rubs, or gallops. Respiratory: Normal respiratory effort without tachypnea nor retractions. Breath sounds are clear and equal bilaterally. No wheezes/rales/rhonchi. Gastrointestinal: Soft and nontender. Normal bowel sounds Musculoskeletal: Nontender with normal range of motion in extremities. No lower extremity tenderness nor edema. Neurologic:  Normal speech and language. No gross focal neurologic deficits are appreciated.  Skin:  Skin is warm, dry and intact. No rash noted. Psychiatric: Mood and affect are normal. Speech and behavior are normal.  ____________________________________________  ED COURSE:  As part of my medical decision making, I reviewed the following data within the electronic MEDICAL RECORD NUMBER History obtained from family if available, nursing notes, old chart and ekg, as well as notes from prior ED visits. Patient presented for nausea, vomiting and diarrhea, we will assess with labs as indicated at this time.   Procedures  Kerry CedarRyan Keniston was evaluated in Emergency Department on 12/20/2018 for the symptoms described in the history of present illness. He was evaluated in the context of the global COVID-19 pandemic, which necessitated consideration  that the patient might be at risk for infection with the SARS-CoV-2 virus that causes COVID-19. Institutional protocols and algorithms that pertain to the evaluation of patients at risk for COVID-19 are in a state of rapid change based on information released by regulatory bodies including the CDC and federal and state organizations. These policies and algorithms were followed during the patient's care in the ED.  ____________________________________________    LABS (pertinent positives/negatives)  Labs Reviewed  CBC WITH DIFFERENTIAL/PLATELET - Abnormal; Notable for the following components:      Result Value   RBC 5.83 (*)    Hemoglobin 17.7 (*)    All other components within normal limits  COMPREHENSIVE METABOLIC PANEL  LIPASE, BLOOD   ____________________________________________   DIFFERENTIAL DIAGNOSIS   Gastroenteritis, dehydration, electrolyte abnormality, coronavirus  FINAL ASSESSMENT AND PLAN  Gastroenteritis, dehydration   Plan: The patient had presented for nausea, vomiting and diarrhea. Patient's labs not reveal any acute process and he currently feels better.  He will be discharged with antiemetics and is cleared for outpatient follow-up.Ulice Dash, MD    Note: This note was generated in part or whole with voice recognition software. Voice recognition is usually quite accurate but there are transcription errors that can and very often do occur. I apologize for any typographical errors that were not detected and corrected.     Emily Filbert, MD 12/20/18 1308

## 2020-07-10 IMAGING — CR CHEST - 2 VIEW
1 series · 2 of 2 positions shown · non-contrast
Comparison: None.

CLINICAL DATA: Chest pain and nausea

EXAM:
CHEST - 2 VIEW

[Series 1: w chest pa · 0.14mm/px · 2 of 2 slices shown]
[im 1/2]
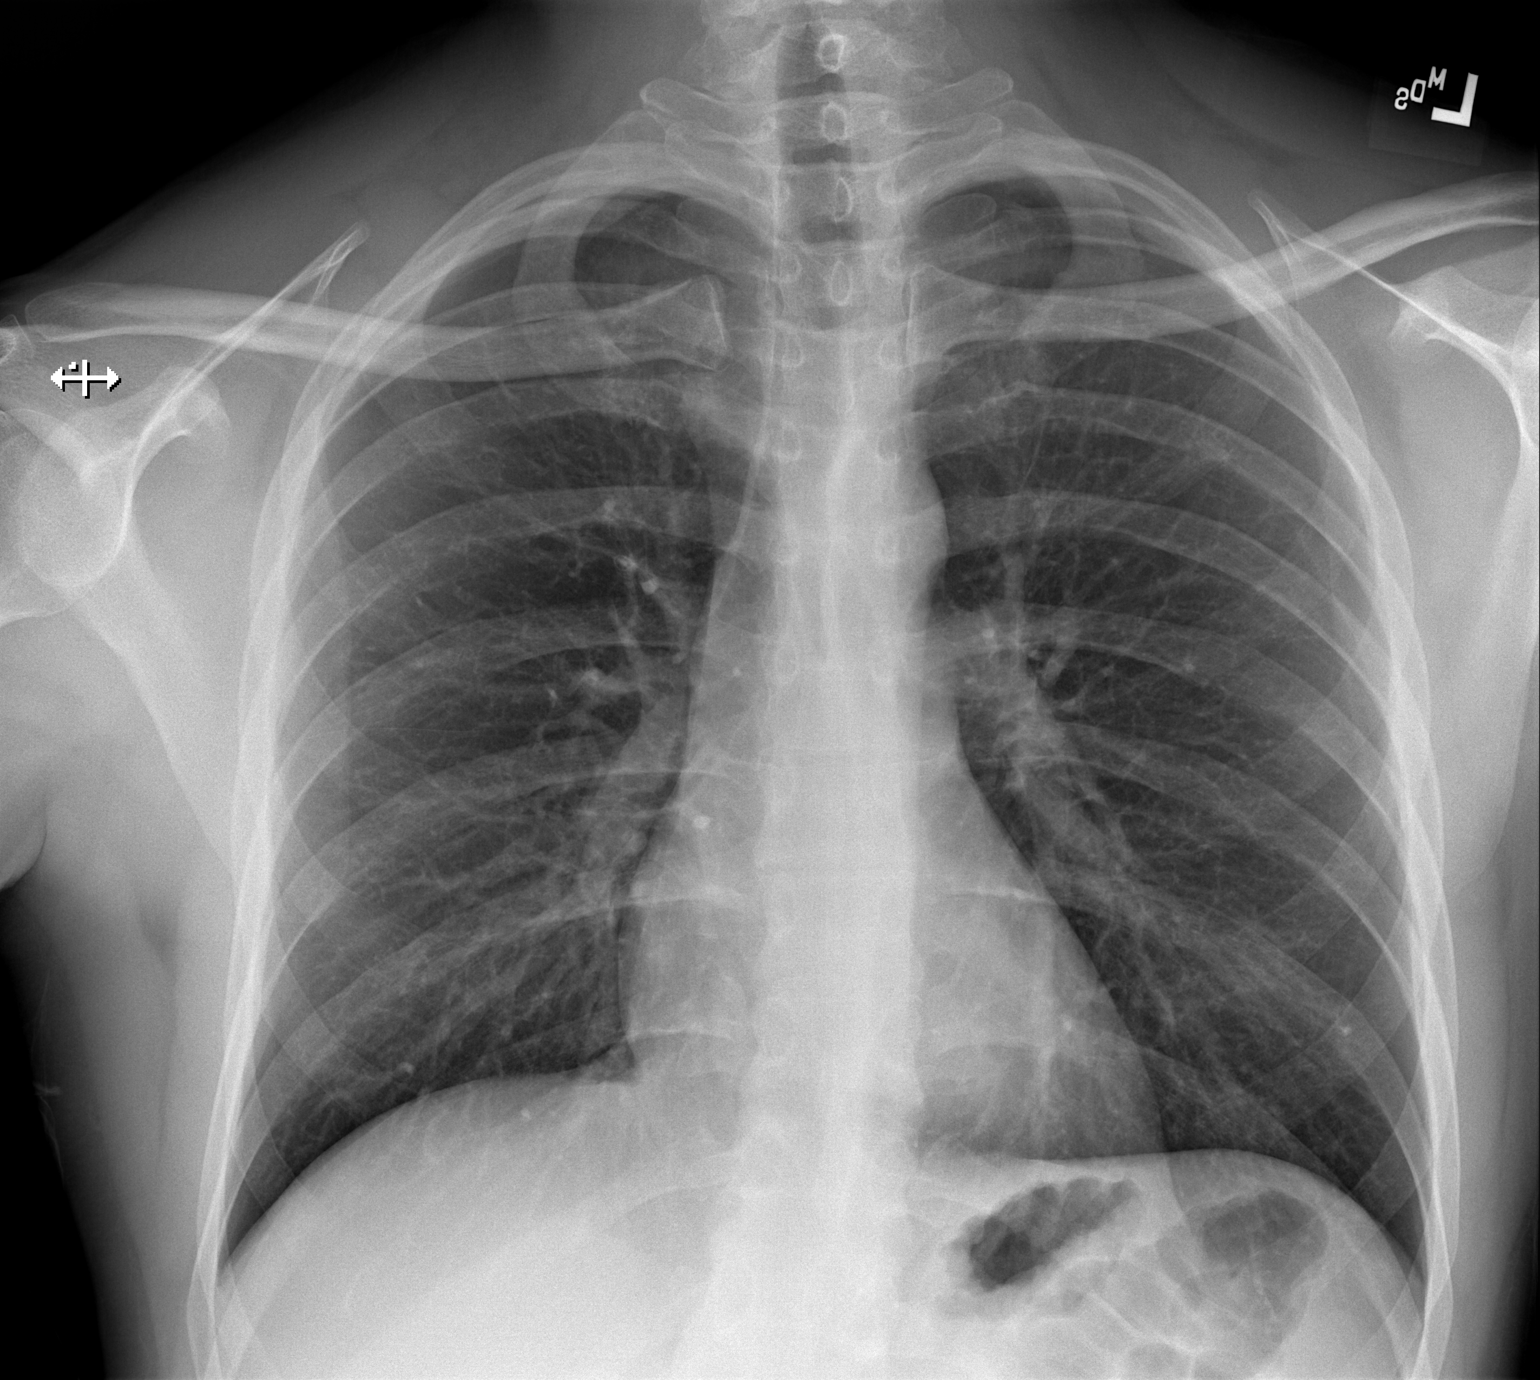
[im 2/2]
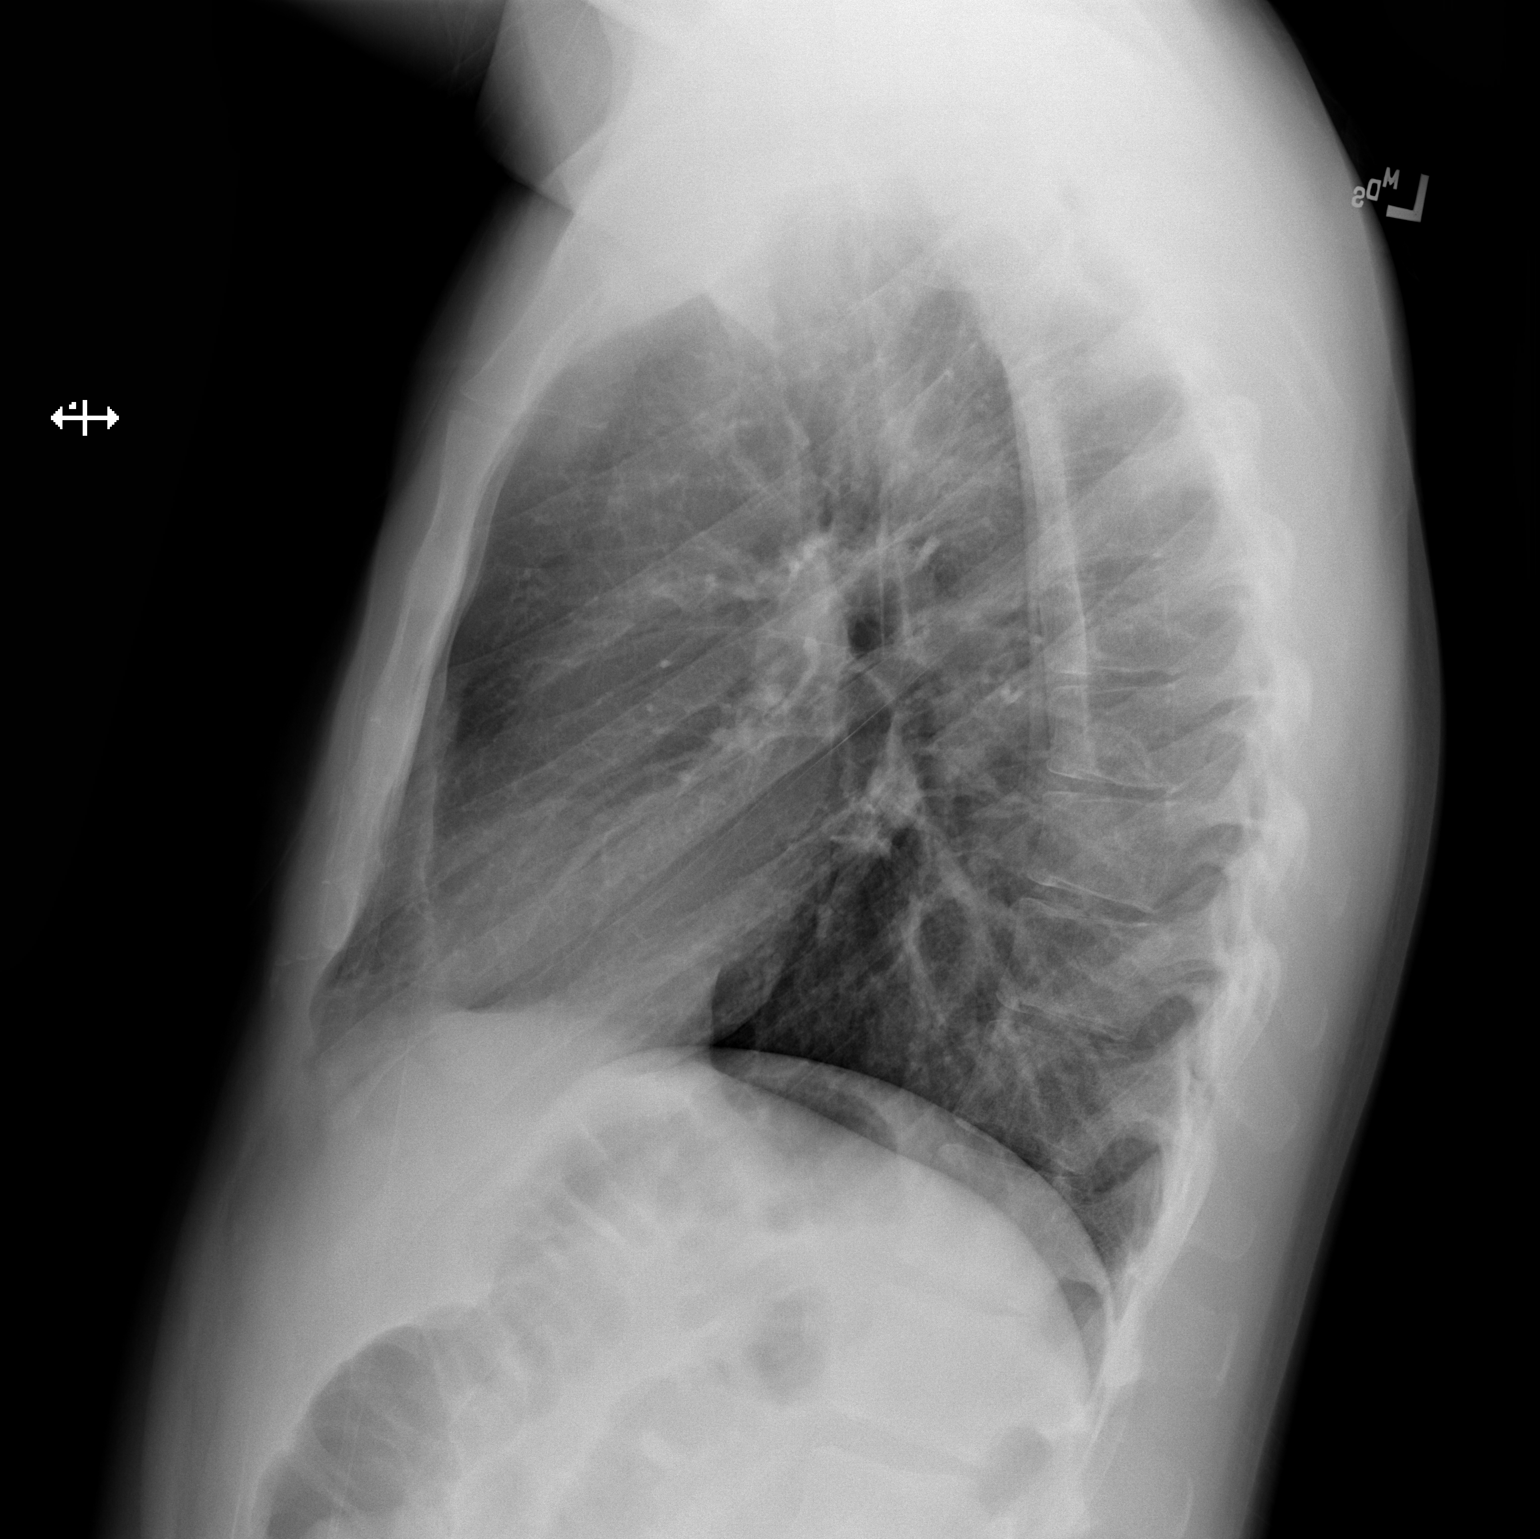

[2 of 2 positions shown; findings below may reference images not displayed]

FINDINGS: Lungs are clear. Heart size and pulmonary vascularity are normal. No
adenopathy. No pneumothorax. No bone lesions.
IMPRESSION: No edema or consolidation.

## 2024-02-07 ENCOUNTER — Ambulatory Visit: Payer: Self-pay
